# Patient Record
Sex: Female | Born: 1935 | Race: White | Hispanic: No | Smoking: Never smoker
Health system: Southern US, Community
[De-identification: ages and names within clinical notes are randomized; demographics above are authoritative.]

## PROBLEM LIST (undated history)

## (undated) DIAGNOSIS — A0472 Enterocolitis due to Clostridium difficile, not specified as recurrent: Secondary | ICD-10-CM

## (undated) DIAGNOSIS — I10 Essential (primary) hypertension: Secondary | ICD-10-CM

## (undated) DIAGNOSIS — I441 Atrioventricular block, second degree: Secondary | ICD-10-CM

## (undated) DIAGNOSIS — Z5189 Encounter for other specified aftercare: Secondary | ICD-10-CM

## (undated) DIAGNOSIS — E039 Hypothyroidism, unspecified: Secondary | ICD-10-CM

## (undated) DIAGNOSIS — J302 Other seasonal allergic rhinitis: Secondary | ICD-10-CM

## (undated) DIAGNOSIS — M1990 Unspecified osteoarthritis, unspecified site: Secondary | ICD-10-CM

## (undated) HISTORY — PX: CYST EXCISION: SHX5701

## (undated) HISTORY — PX: EYE SURGERY: SHX253

## (undated) HISTORY — PX: OTHER SURGICAL HISTORY: SHX169

---
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
## 2000-02-17 ENCOUNTER — Other Ambulatory Visit: Admission: RE | Admit: 2000-02-17 | Discharge: 2000-02-17 | Payer: Self-pay | Admitting: Internal Medicine

## 2005-05-05 ENCOUNTER — Emergency Department (HOSPITAL_COMMUNITY): Admission: EM | Admit: 2005-05-05 | Discharge: 2005-05-05 | Payer: Self-pay | Admitting: Emergency Medicine

## 2006-11-21 ENCOUNTER — Emergency Department (HOSPITAL_COMMUNITY): Admission: EM | Admit: 2006-11-21 | Discharge: 2006-11-21 | Payer: Self-pay | Admitting: Emergency Medicine

## 2011-04-13 ENCOUNTER — Encounter (HOSPITAL_COMMUNITY): Payer: Self-pay | Admitting: Emergency Medicine

## 2011-04-13 ENCOUNTER — Emergency Department (HOSPITAL_COMMUNITY)
Admission: EM | Admit: 2011-04-13 | Discharge: 2011-04-14 | Disposition: A | Discharge status: Home / Self Care | Payer: Medicare PPO | Attending: Emergency Medicine | Admitting: Emergency Medicine

## 2011-04-13 DIAGNOSIS — N939 Abnormal uterine and vaginal bleeding, unspecified: Secondary | ICD-10-CM

## 2011-04-13 DIAGNOSIS — N898 Other specified noninflammatory disorders of vagina: Secondary | ICD-10-CM | POA: Insufficient documentation

## 2011-04-13 HISTORY — DX: Essential (primary) hypertension: I10

## 2011-04-13 NOTE — ED Notes (Signed)
ZOX:[id number]<AV> Expected date:<BR> Expected time:<BR> Means of arrival:<BR> Comments:<BR> HOLd for triage 1

## 2011-04-13 NOTE — ED Provider Notes (Signed)
History     CSN: [id number]  Arrival date & time 04/13/11  2230   First MD Initiated Contact with Patient 04/13/11 2330      Chief Complaint  Patient presents with  . Vaginal Bleeding    (Consider location/radiation/quality/duration/timing/severity/associated sxs/prior treatment) HPI Comments: Went into bathroom, started having bleeding from vagina.  She says this ran down her leg.  No pain injury or trauma.    Patient is a 78 y.o. female presenting with vaginal bleeding. The history is provided by the patient.  Vaginal Bleeding This is a new problem. The current episode started 3 to 5 hours ago. The problem has been resolved. Pertinent negatives include no abdominal pain. The symptoms are aggravated by nothing. The symptoms are relieved by nothing. She has tried nothing for the symptoms.    Past Medical History  Diagnosis Date  . Diabetes mellitus   . Hypertension   . Thyroid disease     History reviewed. No pertinent past surgical history.  No family history on file.  History  Substance Use Topics  . Smoking status: Never Smoker   . Smokeless tobacco: Not on file  . Alcohol Use: No    OB History    Grav Para Term Preterm Abortions TAB SAB Ect Mult Living                  Review of Systems  Gastrointestinal: Negative for abdominal pain.  Genitourinary: Positive for vaginal bleeding.  All other systems reviewed and are negative.    Allergies  Penicillins  Home Medications  No current outpatient prescriptions on file.  BP 210/79  Pulse 74  Temp(Src) 98.4 F (36.9 C) (Oral)  Resp 20  SpO2 99%  Physical Exam  Nursing note and vitals reviewed. Constitutional: She is oriented to person, place, and time. She appears well-developed and well-nourished.  HENT:  Head: Normocephalic and atraumatic.  Neck: Normal range of motion. Neck supple.  Cardiovascular: Normal rate and regular rhythm.   No murmur heard. Pulmonary/Chest: Effort normal and breath  sounds normal. No respiratory distress.  Abdominal: Soft. Bowel sounds are normal. She exhibits no distension. There is no tenderness.  Genitourinary: Vagina normal. No vaginal discharge found.       There was slight blood in the vagina.  No active bleeding.  Otherwise no abnormalities noted.  Musculoskeletal: Normal range of motion. She exhibits no edema.  Neurological: She is alert and oriented to person, place, and time.  Skin: Skin is warm and dry. She is not diaphoretic.    ED Course  Procedures (including critical care time)   Labs Reviewed  CBC  DIFFERENTIAL  COMPREHENSIVE METABOLIC PANEL  APTT  PROTIME-INR  TYPE AND SCREEN   No results found.   No diagnosis found.    MDM  No active bleeding, Hb stable.  US shows what appears to be a mass in the uterus.  She needs to follow up with Gyn.  Was given the name of the on-call Gyn to follow up with.  Should go to [facility] if worsens.        Geoffery Lyons, MD 04/14/11 [phone]

## 2011-04-13 NOTE — ED Notes (Signed)
Pt alert, nad, c/o vaginall bleeding, onset this evening, pt states she was in restroom, sudden onset, states bleeding has decreased, resp even unlabored, skin pwd

## 2011-04-14 ENCOUNTER — Emergency Department (HOSPITAL_COMMUNITY): Payer: Medicare PPO

## 2011-04-14 LAB — APTT: aPTT: 48 seconds — ABNORMAL HIGH (ref 24–37)

## 2011-04-14 LAB — CBC
MCHC: 33.8 g/dL (ref 30.0–36.0)
RBC: 4.23 MIL/uL (ref 3.87–5.11)
WBC: 5.1 10*3/uL (ref 4.0–10.5)

## 2011-04-14 LAB — TYPE AND SCREEN
ABO/RH(D): O POS
Antibody Screen: NEGATIVE

## 2011-04-14 LAB — DIFFERENTIAL
Eosinophils Relative: 4 % (ref 0–5)
Lymphocytes Relative: 23 % (ref 12–46)
Lymphs Abs: 1.2 10*3/uL (ref 0.7–4.0)
Neutro Abs: 3.2 10*3/uL (ref 1.7–7.7)
Neutrophils Relative %: 62 % (ref 43–77)

## 2011-04-14 LAB — COMPREHENSIVE METABOLIC PANEL
AST: 20 U/L (ref 0–37)
Alkaline Phosphatase: 118 U/L — ABNORMAL HIGH (ref 39–117)
BUN: 16 mg/dL (ref 6–23)
CO2: 31 mEq/L (ref 19–32)
GFR calc Af Amer: 89 mL/min — ABNORMAL LOW (ref >90)
GFR calc non Af Amer: 77 mL/min — ABNORMAL LOW (ref >90)
Sodium: 141 mEq/L (ref 135–145)
Total Protein: 6.8 g/dL (ref 6.0–8.3)

## 2011-04-14 LAB — PROTIME-INR: INR: 1.07 (ref 0.00–1.49)

## 2011-04-14 LAB — ABO/RH: ABO/RH(D): O POS

## 2011-05-30 ENCOUNTER — Encounter (HOSPITAL_COMMUNITY): Payer: Self-pay

## 2011-05-31 ENCOUNTER — Other Ambulatory Visit: Payer: Self-pay | Admitting: Obstetrics & Gynecology

## 2011-06-08 ENCOUNTER — Encounter (HOSPITAL_COMMUNITY)
Admission: RE | Admit: 2011-06-08 | Discharge: 2011-06-08 | Disposition: A | Discharge status: Home / Self Care | Payer: Medicare PPO | Source: Ambulatory Visit | Attending: Obstetrics & Gynecology | Admitting: Obstetrics & Gynecology

## 2011-06-08 ENCOUNTER — Other Ambulatory Visit: Payer: Self-pay

## 2011-06-08 ENCOUNTER — Encounter (HOSPITAL_COMMUNITY): Payer: Self-pay

## 2011-06-08 HISTORY — DX: Hypothyroidism, unspecified: E03.9

## 2011-06-08 HISTORY — DX: Unspecified osteoarthritis, unspecified site: M19.90

## 2011-06-08 HISTORY — DX: Encounter for other specified aftercare: Z51.89

## 2011-06-08 HISTORY — DX: Other seasonal allergic rhinitis: J30.2

## 2011-06-08 LAB — CBC
MCH: 31.6 pg (ref 26.0–34.0)
MCV: 94.7 fL (ref 78.0–100.0)
Platelets: 173 10*3/uL (ref 150–400)
RBC: 4.34 MIL/uL (ref 3.87–5.11)
RDW: 14.1 % (ref 11.5–15.5)
WBC: 6.2 10*3/uL (ref 4.0–10.5)

## 2011-06-08 LAB — BASIC METABOLIC PANEL
CO2: 29 mEq/L (ref 19–32)
Calcium: 9.2 mg/dL (ref 8.4–10.5)
Creatinine, Ser: 0.78 mg/dL (ref 0.50–1.10)
GFR calc non Af Amer: 73 mL/min — ABNORMAL LOW (ref >90)
Glucose, Bld: 122 mg/dL — ABNORMAL HIGH (ref 70–99)
Sodium: 140 mEq/L (ref 135–145)

## 2011-06-08 MED ORDER — DEXTROSE 5 % IV SOLN
INTRAVENOUS | Status: AC
  Administered 2011-06-09: 100 mL via INTRAVENOUS
  Filled 2011-06-08: qty 2.5

## 2011-06-08 NOTE — Pre-Procedure Instructions (Signed)
Spoke with Dr Rodman Pickle, patient instructed to take bp meds, synthroid, and 1/2 NPH dose Thursday evening and 1/2 Friday morning dose DOS 06/09/11.  Dr Rodman Pickle informed bp today was 168/75 with 66 pulse.  No orders given.

## 2011-06-08 NOTE — Pre-Procedure Instructions (Signed)
Dr Rodman Pickle spoke with patient and her daughter Kari Ford.  Discussed her EKG results.  Ok for surgery as scheduled.  Dr Rodman Pickle to speak with Dr Brayton Caves as plan on DOS.  EKG copy given to patient to share with her primary care physician Dr Merri Brunette.

## 2011-06-08 NOTE — Patient Instructions (Addendum)
   Your procedure is scheduled on: Friday, April 12th   Enter through the [employer] of [facility] at: 6:00am Pick up the phone at the desk and dial [phone] and inform us of your arrival.  Please call this number if you have any problems the morning of surgery: [phone]  Remember: Do not eat food after midnight:Thursday Do not drink clear liquids after: Thursday Take these medicines the morning of surgery with a SIP OF WATER:  Per Anesthesia,take BP meds and Synthroid with small sip of water Friday morning - DOS.  Patient instructed to take 1/2 NPH evening dose Thursday night and 1/2 Friday morning dose DOS.   Do not wear jewelry, make-up, or FINGER nail polish Do not wear lotions, powders, perfumes or deodorant. Do not shave 48 hours prior to surgery. Do not bring valuables to the hospital. Contacts, dentures or bridgework may not be worn into surgery.  . Patients discharged on the day of surgery will not be allowed to drive home.  Home with daughter, Quatisha Zylka  cell [phone]   Remember to use your hibiclens as instructed.Please shower with 1/2 bottle the evening before your surgery and the other 1/2 bottle the morning of surgery. Neck down avoiding private area.

## 2011-06-08 NOTE — Anesthesia Preprocedure Evaluation (Signed)
Anesthesia Evaluation  Patient identified by MRN, date of birth, ID band Patient awake    Reviewed: Allergy & Precautions, H&P , NPO status , Patient's Chart, lab work & pertinent test results, reviewed documented beta blocker date and time   History of Anesthesia Complications Negative for: history of anesthetic complications  Airway       Dental   Pulmonary neg pulmonary ROS,          Cardiovascular Exercise Tolerance: Good hypertension, On Medications and On Home Beta Blockers  EKG shows first degree av block and PVCs.  No prior EKG available for comparison.  Discussed with patient need for further testing - patient refuses.     Neuro/Psych negative neurological ROS  negative psych ROS   GI/Hepatic negative GI ROS, Neg liver ROS,   Endo/Other  Diabetes mellitus-, Type 2, Insulin DependentHypothyroidism   Renal/GU negative Renal ROS  negative genitourinary   Musculoskeletal  (+) Arthritis -,   Abdominal   Peds  Hematology negative hematology ROS (+)   Anesthesia Other Findings   Reproductive/Obstetrics negative OB ROS                           Anesthesia Physical Anesthesia Plan  ASA: III  Anesthesia Plan: MAC   Post-op Pain Management:    Induction:   Airway Management Planned:   Additional Equipment:   Intra-op Plan:   Post-operative Plan:   Informed Consent: I have reviewed the patients History and Physical, chart, labs and discussed the procedure including the risks, benefits and alternatives for the proposed anesthesia with the patient or authorized representative who has indicated his/her understanding and acceptance.   Dental Advisory Given  Plan Discussed with: CRNA and Surgeon  Anesthesia Plan Comments: (Discussed with patient and daughter [name]) with Janene Harvey, RN present, that due to EKG findings it is my recommendation that patient undergo evaluation by her  PCP and may need further testing prior to surgery and anesthesia.  Patient is adamant that she not have further testing.  She says that "if it's my time, it's my time."  She says that "God has protected me for almost 89 years and if he is ready for me to go then I am ready."  Daughter cites patient's lack of CP or SOB as evidence that she does not have heart problems.  Both want to proceed without further testing against my recommendations, and very politely thanked me for my input.  The patient wants the least amount of intervention possible and is planning to have the procedure under local anesthesia.  I have discussed this with Dr Juliene Pina.)        Anesthesia Quick Evaluation

## 2011-06-08 NOTE — Pre-Procedure Instructions (Signed)
Sangita in lab at [facility] hospital informed of patient's history of prior blood transfusion.

## 2011-06-09 ENCOUNTER — Ambulatory Visit (HOSPITAL_COMMUNITY)
Admission: RE | Admit: 2011-06-09 | Discharge: 2011-06-09 | Disposition: A | Discharge status: Home / Self Care | Payer: Medicare PPO | Source: Ambulatory Visit | Attending: Obstetrics & Gynecology | Admitting: Obstetrics & Gynecology

## 2011-06-09 ENCOUNTER — Encounter (HOSPITAL_COMMUNITY)
Admission: RE | Disposition: A | Discharge status: Home / Self Care | Payer: Self-pay | Source: Ambulatory Visit | Attending: Obstetrics & Gynecology

## 2011-06-09 ENCOUNTER — Encounter (HOSPITAL_COMMUNITY): Payer: Self-pay | Admitting: Anesthesiology

## 2011-06-09 ENCOUNTER — Ambulatory Visit (HOSPITAL_COMMUNITY): Payer: Medicare PPO | Admitting: Anesthesiology

## 2011-06-09 DIAGNOSIS — I10 Essential (primary) hypertension: Secondary | ICD-10-CM | POA: Insufficient documentation

## 2011-06-09 DIAGNOSIS — E119 Type 2 diabetes mellitus without complications: Secondary | ICD-10-CM | POA: Insufficient documentation

## 2011-06-09 DIAGNOSIS — N840 Polyp of corpus uteri: Secondary | ICD-10-CM | POA: Insufficient documentation

## 2011-06-09 DIAGNOSIS — Z01812 Encounter for preprocedural laboratory examination: Secondary | ICD-10-CM | POA: Insufficient documentation

## 2011-06-09 DIAGNOSIS — Z01818 Encounter for other preprocedural examination: Secondary | ICD-10-CM | POA: Insufficient documentation

## 2011-06-09 DIAGNOSIS — N950 Postmenopausal bleeding: Secondary | ICD-10-CM | POA: Insufficient documentation

## 2011-06-09 DIAGNOSIS — E039 Hypothyroidism, unspecified: Secondary | ICD-10-CM | POA: Insufficient documentation

## 2011-06-09 LAB — GLUCOSE, CAPILLARY: Glucose-Capillary: 129 mg/dL — ABNORMAL HIGH (ref 70–99)

## 2011-06-09 SURGERY — DILATATION & CURETTAGE/HYSTEROSCOPY WITH RESECTOCOPE
Anesthesia: Monitor Anesthesia Care | Site: Vagina | Wound class: Clean Contaminated

## 2011-06-09 MED ORDER — BUPIVACAINE HCL 0.75 % IJ SOLN
INTRAMUSCULAR | Status: DC | PRN
  Administered 2011-06-09: 10 mg via INTRATHECAL

## 2011-06-09 MED ORDER — FENTANYL CITRATE 0.05 MG/ML IJ SOLN
INTRAMUSCULAR | Status: AC
  Filled 2011-06-09: qty 2

## 2011-06-09 MED ORDER — LIDOCAINE HCL 1 % IJ SOLN
INTRAMUSCULAR | Status: DC | PRN
  Administered 2011-06-09: 20 mL

## 2011-06-09 MED ORDER — LACTATED RINGERS IV SOLN
INTRAVENOUS | Status: DC
  Administered 2011-06-09: 07:00:00 via INTRAVENOUS

## 2011-06-09 MED ORDER — LIDOCAINE HCL 0.5 % IJ SOLN
INTRAMUSCULAR | Status: AC
  Filled 2011-06-09: qty 1

## 2011-06-09 MED ORDER — SODIUM CHLORIDE 0.9 % IR SOLN
Status: DC | PRN
  Administered 2011-06-09 (×2): 3000 mL

## 2011-06-09 SURGICAL SUPPLY — 20 items
CANISTER SUCTION 2500CC (MISCELLANEOUS) ×2 IMPLANT
CATH ROBINSON RED A/P 16FR (CATHETERS) ×2 IMPLANT
CLOTH BEACON ORANGE TIMEOUT ST (SAFETY) ×2 IMPLANT
CONTAINER PREFILL 10% NBF 60ML (FORM) ×4 IMPLANT
DILATOR CANAL MILEX (MISCELLANEOUS) ×1 IMPLANT
ELECT REM PT RETURN 9FT ADLT (ELECTROSURGICAL) ×2
ELECTRODE REM PT RTRN 9FT ADLT (ELECTROSURGICAL) ×1 IMPLANT
ELECTRODE ROLLER VERSAPOINT (ELECTRODE) IMPLANT
ELECTRODE RT ANGLE VERSAPOINT (CUTTING LOOP) ×1 IMPLANT
GLOVE BIO SURGEON STRL SZ7 (GLOVE) ×4 IMPLANT
GLOVE BIOGEL PI IND STRL 7.0 (GLOVE) ×1 IMPLANT
GLOVE BIOGEL PI INDICATOR 7.0 (GLOVE) ×1
GOWN PREVENTION PLUS LG XLONG (DISPOSABLE) ×4 IMPLANT
GOWN STRL REIN XL XLG (GOWN DISPOSABLE) ×2 IMPLANT
LOOP ANGLED CUTTING 22FR (CUTTING LOOP) IMPLANT
PACK HYSTEROSCOPY LF (CUSTOM PROCEDURE TRAY) ×2 IMPLANT
SUT VICRYL 0 UR6 27IN ABS (SUTURE) ×1 IMPLANT
TOWEL OR 17X24 6PK STRL BLUE (TOWEL DISPOSABLE) ×4 IMPLANT
TUBING HYDROFLEX HYSTEROSCOPY (TUBING) IMPLANT
WATER STERILE IRR 1000ML POUR (IV SOLUTION) ×2 IMPLANT

## 2011-06-09 NOTE — Anesthesia Procedure Notes (Signed)
Spinal  Patient location during procedure: OR Start time: 06/09/2011 7:41 AM Staffing Anesthesiologist: Brayton Caves R Performed by: anesthesiologist  Preanesthetic Checklist Completed: patient identified, site marked, surgical consent, pre-op evaluation, timeout performed, IV checked, risks and benefits discussed and monitors and equipment checked Spinal Block Patient position: sitting Prep: DuraPrep Patient monitoring: heart rate, cardiac monitor, continuous pulse ox and blood pressure Approach: midline Location: L3-4 Injection technique: single-shot Needle Needle type: Sprotte  Needle gauge: 24 G Needle length: 9 cm Assessment Sensory level: T4 Additional Notes Patient identified.  Risk benefits discussed including failed block, incomplete pain control, headache, nerve damage, paralysis, blood pressure changes, nausea, vomiting, reactions to medication both toxic or allergic, and postpartum back pain.  Patient expressed understanding and wished to proceed.  All questions were answered.  Sterile technique used throughout procedure.  CSF was clear.  No parasthesia or other complications.  Please see nursing notes for vital signs.

## 2011-06-09 NOTE — Discharge Instructions (Signed)
DISCHARGE INSTRUCTIONS: D&C / D&E The following instructions have been prepared to help you care for yourself upon your return home.   Personal hygiene: Marland Kitchen Use sanitary pads for vaginal drainage, not tampons. . Shower the day after your procedure. . NO tub baths, pools or Jacuzzis for 2-3 weeks. . Wipe front to back after using the bathroom.  Activity and limitations: . Do NOT drive or operate any equipment for 24 hours. The effects of anesthesia are still present and drowsiness may result. . Do NOT rest in bed all day. . Walking is encouraged. . Walk up and down stairs slowly. . You may resume your normal activity in one to two days or as indicated by your physician.  Sexual activity: NO intercourse for at least 2 weeks after the procedure, or as indicated by your physician.  Diet: Eat a light meal as desired this evening. You may resume your usual diet tomorrow.  Return to work: You may resume your work activities in one to two days or as indicated by your doctor.  What to expect after your surgery: Expect to have vaginal bleeding/discharge for 2-3 days and spotting for up to 10 days. It is not unusual to have soreness for up to 1-2 weeks. You may have a slight burning sensation when you urinate for the first day. Mild cramps may continue for a couple of days. You may have a regular period in 2-6 weeks.  Call your doctor for any of the following: . Excessive vaginal bleeding, saturating and changing one pad every hour. . Inability to urinate 6 hours after discharge from hospital. . Pain not relieved by pain medication. . Fever of 100.4 F or greater. . Unusual vaginal discharge or odor.  Return to office ________________ Call for an appointment ___________________  Patient's signature: ______________________  Nurse's signature ________________________  Post Anesthesia Care Unit [phone]

## 2011-06-09 NOTE — Op Note (Addendum)
Preoperative diagnosis: Postmenopausal bleeding with endometrial mass, failed office biopsy Postop diagnosis: as above.  Procedure: Hysteroscopic polypectomy with Versapoint Anesthesia: Spinal  Surgeon: Shea Evans, MD  Assistant:  none IV fluids : LR Saline fluid deficit: 600 cc Estimated blood loss: 30 cc  Urine output: straight catheter preop   Complications none  Condition: stable  Disposition:  PACU and then home Specimen: Resected endometrial polyp  Procedure  Indication: Postmenopausal bleeding, pelvic ultrasound noted large endometrial mass. Two office endometrial biopsies failed to obtain endometrial tissue for pathologic evaluation, hence decision made to undergo Hysteroscopic evaluation.  Patient was counseled on risks/ complications including infection, bleeding, damage to internal organs, she understood and agrees, gave informed written consent.  Patient was brought to the operating room with IV running. Time out was carried out. She received preop Gentamicin and Clindamycin. She underwent Spinal anesthesia without complications. She was given dorsolithotomy position. Parts were prepped and draped in standard fashion. Bladder was catheterized once (before annd after the surgery). Bimanual exam revealed uterus to be anteverted and normal size. Speculum was placed and cervix was grasped with single-tooth tenaculum.   Cervical block with 10 cc 1% plain Xylocaine given. The uterus was sounded to 8 cm. Cervical os was dilated to 25 F. Diagnostic Hysteroscope was introduced in the uterine cavity under vision, using saline for irrigation. A large endometrial polyp noted from lower right side of the uterine cavity. Normal tubal osteii noted. Versapoint hysteroscope switched. Polyp resection done with loop electrode and segments of resected polyp sent to pathology. Hemostasis was excellent. Hysteroscope was removed. Fluid deficit 600 cc.  Anterior lip of cervix was bleeding where  tenaculum caused a laceration. It was sutured with 0Vicryl.  All instruments removed. All counts are correct x2. No complications.  Patient was made supine and brought to the recovery room in stable condition.  Patient will be discharged home today.  Follow up in 2 weeks in office. Surgical findings, warning signs of infection and excessive bleeding reviewed with patient and her family.   V.Amari Zagal, MD.

## 2011-06-09 NOTE — H&P (Addendum)
Kari Ford is an 78 y.o. female with postmenopausal bleeding since Feb'13. Sono notes endometrial mass. Office endometrial biopsy attempted twice with cytotec pre-biopsy, 2nd attempt was thought to be successful but path noted no endometrial tissue. Patient here for more definitve diagnosis since high likelihood of endometrial cancer.  Plan was to have procedure under regional anesthesia. But due to EKG changes and patient's reluctance to undergo further testing of her heart, we are proceeding with H/scopy and biopsy under local anesthesia.  Normal paps, not sexually active since widowed. Normal breast exam and mammogram, but not regular with it, no h/o breast cancer.      Past Medical History  Diagnosis Date  . Diabetes mellitus   . Hypertension   . Thyroid disease   . Hypothyroidism   . Blood transfusion 1952    Bld Transfusion w/C/S  - [facility] -[place], [place]  . Seasonal allergies     no meds  . Arthritis     shoulder, back    Past Surgical History  Procedure Date  . Eye surgery     bilateral - cataract surgery  . Svd     x 1    No family history on file.  Social History:  reports that she has never smoked. She has never used smokeless tobacco. She reports that she does not drink alcohol or use illicit drugs.  Allergies:  Allergies  Allergen Reactions  . Penicillins Hives    Prescriptions prior to admission  Medication Sig Dispense Refill  . aspirin 81 MG EC tablet Take 81 mg by mouth daily. Swallow whole.      . insulin NPH (HUMULIN N,NOVOLIN N) 100 UNIT/ML injection Inject 10-18 Units into the skin 2 (two) times daily. 18 units in the morning and 10 units in the evening      . irbesartan (AVAPRO) 300 MG tablet Take 300 mg by mouth daily.      Marland Kitchen levothyroxine (SYNTHROID, LEVOTHROID) 50 MCG tablet Take 50 mcg by mouth daily.      . nebivolol (BYSTOLIC) 2.5 MG tablet Take 2.5 mg by mouth daily.        Review of Systems  Constitutional: Negative for fever.    HENT: Positive for hearing loss.   Eyes: Negative for blurred vision and double vision.  Respiratory: Negative for shortness of breath.   Cardiovascular: Negative for chest pain.  Gastrointestinal: Negative for heartburn.  Genitourinary: Negative for dysuria.  Neurological: Negative for dizziness and headaches.    Physical Exam Blood pressure 192/50, pulse 51, temperature 97.9 F (36.6 C), temperature source Oral, resp. rate 18, SpO2 99.00% A&O x 3, no acute distress. Pleasant HEENT neg Lungs CTA bilat CV RRR,S1S2 normal Abdo soft, non tender, non acute Extr no edema/ tenderness Pelvic  Cervical stenosis, no masses in vagina or on cervix. Slightly enlarged uterus. No adnexal masses   Results for orders placed during the hospital encounter of 06/08/11 (from the past 24 hour(s))  BASIC METABOLIC PANEL     Status: Abnormal   Collection Time   06/08/11 11:06 AM      Component Value Range   Sodium 140  135 - 145 (mEq/L)   Potassium 4.4  3.5 - 5.1 (mEq/L)   Chloride 103  96 - 112 (mEq/L)   CO2 29  19 - 32 (mEq/L)   Glucose, Bld 122 (*) 70 - 99 (mg/dL)   BUN 19  6 - 23 (mg/dL)   Creatinine, Ser 0.86  0.50 - 1.10 (  mg/dL)   Calcium 9.2  8.4 - 16.1 (mg/dL)   GFR calc non Af Amer 73 (*) >90 (mL/min)   GFR calc Af Amer 84 (*) >90 (mL/min)  CBC     Status: Normal   Collection Time   06/08/11 11:06 AM      Component Value Range   WBC 6.2  4.0 - 10.5 (K/uL)   RBC 4.34  3.87 - 5.11 (MIL/uL)   Hemoglobin 13.7  12.0 - 15.0 (g/dL)   HCT 09.6  04.5 - 40.9 (%)   MCV 94.7  78.0 - 100.0 (fL)   MCH 31.6  26.0 - 34.0 (pg)   MCHC 33.3  30.0 - 36.0 (g/dL)   RDW 81.1  91.4 - 78.2 (%)   Platelets 173  150 - 400 (K/uL)    Assessment/Plan: 78 yo woman with DM. HTN, hypothyroidism. Postmenopausal bleeding, unable to get office endometrial biopsy. Here for Hysteroscopic eval and possible removal of endometrial mass.   Risks/complications of surgery reviewed incl infection, bleeding, damage to  internal organs including bladder, bowels, ureters, blood vessels, other risks from anesthesia, VTE and delayed complications of any surgery, complications in future surgery reviewed.   Joda Braatz R 06/09/2011, 6:33 AM

## 2011-06-09 NOTE — Transfer of Care (Signed)
Immediate Anesthesia Transfer of Care Note  Patient: Kari Ford  Procedure(s) Performed: Procedure(s) (LRB): DILATATION & CURETTAGE/HYSTEROSCOPY WITH RESECTOCOPE (N/A)  Patient Location: PACU  Anesthesia Type: Spinal  Level of Consciousness: awake, alert  and oriented  Airway & Oxygen Therapy: Patient Spontanous Breathing  Post-op Assessment: Report given to PACU RN and Post -op Vital signs reviewed and stable  Post vital signs: stable  Complications: No apparent anesthesia complications

## 2011-06-09 NOTE — Anesthesia Postprocedure Evaluation (Signed)
Anesthesia Post Note  Patient: Kari Ford  Procedure(s) Performed: Procedure(s) (LRB): DILATATION & CURETTAGE/HYSTEROSCOPY WITH RESECTOCOPE (N/A)  Anesthesia type: GA  Patient location: PACU  Post pain: Pain level controlled  Post assessment: Post-op Vital signs reviewed  Last Vitals:  Filed Vitals:   06/09/11 0945  BP: 167/59  Pulse: 62  Temp:   Resp:     Post vital signs: Reviewed  Level of consciousness: sedated  Complications: No apparent anesthesia complications

## 2012-06-26 ENCOUNTER — Other Ambulatory Visit: Payer: Self-pay

## 2012-08-29 ENCOUNTER — Encounter (HOSPITAL_COMMUNITY): Payer: Self-pay | Admitting: Emergency Medicine

## 2012-08-29 ENCOUNTER — Inpatient Hospital Stay (HOSPITAL_COMMUNITY)
Admission: EM | Admit: 2012-08-29 | Discharge: 2012-08-31 | DRG: 603 | Disposition: A | Discharge status: Home Health | Payer: Medicare PPO | Attending: Internal Medicine | Admitting: Internal Medicine

## 2012-08-29 DIAGNOSIS — R739 Hyperglycemia, unspecified: Secondary | ICD-10-CM

## 2012-08-29 DIAGNOSIS — I10 Essential (primary) hypertension: Secondary | ICD-10-CM | POA: Diagnosis present

## 2012-08-29 DIAGNOSIS — L02419 Cutaneous abscess of limb, unspecified: Principal | ICD-10-CM | POA: Diagnosis present

## 2012-08-29 DIAGNOSIS — E039 Hypothyroidism, unspecified: Secondary | ICD-10-CM | POA: Diagnosis present

## 2012-08-29 DIAGNOSIS — L03116 Cellulitis of left lower limb: Secondary | ICD-10-CM | POA: Diagnosis present

## 2012-08-29 DIAGNOSIS — R609 Edema, unspecified: Secondary | ICD-10-CM | POA: Diagnosis present

## 2012-08-29 DIAGNOSIS — M7989 Other specified soft tissue disorders: Secondary | ICD-10-CM | POA: Diagnosis present

## 2012-08-29 DIAGNOSIS — E860 Dehydration: Secondary | ICD-10-CM

## 2012-08-29 DIAGNOSIS — E876 Hypokalemia: Secondary | ICD-10-CM | POA: Diagnosis present

## 2012-08-29 DIAGNOSIS — M79609 Pain in unspecified limb: Secondary | ICD-10-CM | POA: Diagnosis present

## 2012-08-29 DIAGNOSIS — E119 Type 2 diabetes mellitus without complications: Secondary | ICD-10-CM | POA: Diagnosis present

## 2012-08-29 LAB — CBC WITH DIFFERENTIAL/PLATELET
Basophils Absolute: 0.1 10*3/uL (ref 0.0–0.1)
Eosinophils Relative: 4 % (ref 0–5)
Lymphocytes Relative: 25 % (ref 12–46)
Lymphs Abs: 1.4 10*3/uL (ref 0.7–4.0)
MCV: 93.8 fL (ref 78.0–100.0)
Neutro Abs: 3.3 10*3/uL (ref 1.7–7.7)
Platelets: 147 10*3/uL — ABNORMAL LOW (ref 150–400)
RBC: 3.85 MIL/uL — ABNORMAL LOW (ref 3.87–5.11)
WBC: 5.4 10*3/uL (ref 4.0–10.5)

## 2012-08-29 LAB — BASIC METABOLIC PANEL
CO2: 33 mEq/L — ABNORMAL HIGH (ref 19–32)
Calcium: 9.2 mg/dL (ref 8.4–10.5)
Chloride: 101 mEq/L (ref 96–112)
Glucose, Bld: 212 mg/dL — ABNORMAL HIGH (ref 70–99)
Potassium: 3.9 mEq/L (ref 3.5–5.1)
Sodium: 139 mEq/L (ref 135–145)

## 2012-08-29 LAB — GLUCOSE, CAPILLARY: Glucose-Capillary: 180 mg/dL — ABNORMAL HIGH (ref 70–99)

## 2012-08-29 NOTE — ED Notes (Addendum)
Patient with redness and swelling to left leg.  She fell Monday at home and left leg also has some bruising and she has a healing laceration to left elbow.  Daughter reports patient's blood sugar has been dropping recently into the 20's and 30's.  No recent changes to her appetite or medications.  She takes humulin insulin 13 units in AM and 10 in evening.  She is also having pain and redness in her left second toe.

## 2012-08-30 ENCOUNTER — Emergency Department (HOSPITAL_COMMUNITY): Payer: Medicare PPO

## 2012-08-30 ENCOUNTER — Encounter (HOSPITAL_COMMUNITY): Payer: Self-pay | Admitting: Orthopedic Surgery

## 2012-08-30 DIAGNOSIS — E039 Hypothyroidism, unspecified: Secondary | ICD-10-CM | POA: Diagnosis present

## 2012-08-30 DIAGNOSIS — L02419 Cutaneous abscess of limb, unspecified: Principal | ICD-10-CM

## 2012-08-30 DIAGNOSIS — E119 Type 2 diabetes mellitus without complications: Secondary | ICD-10-CM | POA: Diagnosis present

## 2012-08-30 DIAGNOSIS — E860 Dehydration: Secondary | ICD-10-CM

## 2012-08-30 DIAGNOSIS — L03116 Cellulitis of left lower limb: Secondary | ICD-10-CM | POA: Diagnosis present

## 2012-08-30 DIAGNOSIS — L03119 Cellulitis of unspecified part of limb: Principal | ICD-10-CM

## 2012-08-30 DIAGNOSIS — M7989 Other specified soft tissue disorders: Secondary | ICD-10-CM

## 2012-08-30 DIAGNOSIS — E876 Hypokalemia: Secondary | ICD-10-CM | POA: Diagnosis present

## 2012-08-30 LAB — GLUCOSE, CAPILLARY
Glucose-Capillary: 128 mg/dL — ABNORMAL HIGH (ref 70–99)
Glucose-Capillary: 215 mg/dL — ABNORMAL HIGH (ref 70–99)
Glucose-Capillary: 176 mg/dL — ABNORMAL HIGH (ref 70–99)

## 2012-08-30 LAB — BASIC METABOLIC PANEL
BUN: 25 mg/dL — ABNORMAL HIGH (ref 6–23)
GFR calc non Af Amer: 75 mL/min — ABNORMAL LOW (ref >90)
Glucose, Bld: 207 mg/dL — ABNORMAL HIGH (ref 70–99)
Potassium: 3.4 mEq/L — ABNORMAL LOW (ref 3.5–5.1)

## 2012-08-30 LAB — URINALYSIS, ROUTINE W REFLEX MICROSCOPIC
Glucose, UA: 100 mg/dL — AB
Leukocytes, UA: NEGATIVE
Nitrite: NEGATIVE
Specific Gravity, Urine: 1.02 (ref 1.005–1.030)
pH: 7.5 (ref 5.0–8.0)

## 2012-08-30 LAB — CBC
HCT: 32.1 % — ABNORMAL LOW (ref 36.0–46.0)
Hemoglobin: 10.9 g/dL — ABNORMAL LOW (ref 12.0–15.0)
MCH: 31.9 pg (ref 26.0–34.0)
MCHC: 34 g/dL (ref 30.0–36.0)

## 2012-08-30 LAB — HEMOGLOBIN A1C: Hgb A1c MFr Bld: 5.9 % — ABNORMAL HIGH (ref <5.7)

## 2012-08-30 MED ORDER — VANCOMYCIN HCL 10 G IV SOLR
1250.0000 mg | INTRAVENOUS | Status: DC
  Administered 2012-08-30: 1250 mg via INTRAVENOUS
  Filled 2012-08-30 (×2): qty 1250

## 2012-08-30 MED ORDER — HYDROCODONE-ACETAMINOPHEN 5-325 MG PO TABS: 1.0000 | ORAL_TABLET | ORAL | Status: DC | PRN

## 2012-08-30 MED ORDER — ONDANSETRON HCL 4 MG/2ML IJ SOLN: 4.0000 mg | Freq: Three times a day (TID) | INTRAMUSCULAR | Status: DC | PRN

## 2012-08-30 MED ORDER — OXYCODONE-ACETAMINOPHEN 5-325 MG PO TABS
1.0000 | ORAL_TABLET | Freq: Once | ORAL | Status: AC
  Administered 2012-08-30: 1 via ORAL
  Filled 2012-08-30: qty 1

## 2012-08-30 MED ORDER — ENOXAPARIN SODIUM 30 MG/0.3ML ~~LOC~~ SOLN: 30.0000 mg | SUBCUTANEOUS | Status: DC

## 2012-08-30 MED ORDER — ONDANSETRON HCL 4 MG/2ML IJ SOLN: 4.0000 mg | Freq: Four times a day (QID) | INTRAMUSCULAR | Status: DC | PRN

## 2012-08-30 MED ORDER — IRBESARTAN 300 MG PO TABS
300.0000 mg | ORAL_TABLET | Freq: Every day | ORAL | Status: DC
  Administered 2012-08-30 – 2012-08-31 (×2): 300 mg via ORAL
  Filled 2012-08-30 (×2): qty 1

## 2012-08-30 MED ORDER — SODIUM CHLORIDE 0.9 % IV SOLN
INTRAVENOUS | Status: AC
  Administered 2012-08-30: 04:00:00 via INTRAVENOUS

## 2012-08-30 MED ORDER — ENOXAPARIN SODIUM 40 MG/0.4ML ~~LOC~~ SOLN
40.0000 mg | Freq: Every day | SUBCUTANEOUS | Status: DC
  Administered 2012-08-30: 40 mg via SUBCUTANEOUS
  Filled 2012-08-30 (×2): qty 0.4

## 2012-08-30 MED ORDER — SODIUM CHLORIDE 0.9 % IV SOLN: INTRAVENOUS | Status: DC

## 2012-08-30 MED ORDER — ASPIRIN EC 81 MG PO TBEC
81.0000 mg | DELAYED_RELEASE_TABLET | Freq: Every day | ORAL | Status: DC
Start: 2012-08-30 — End: 2012-08-31
  Administered 2012-08-30 – 2012-08-31 (×2): 81 mg via ORAL
  Filled 2012-08-30 (×2): qty 1

## 2012-08-30 MED ORDER — INSULIN NPH (HUMAN) (ISOPHANE) 100 UNIT/ML ~~LOC~~ SUSP
13.0000 [IU] | Freq: Every day | SUBCUTANEOUS | Status: DC
  Administered 2012-08-30 – 2012-08-31 (×2): 13 [IU] via SUBCUTANEOUS
  Filled 2012-08-30: qty 10

## 2012-08-30 MED ORDER — VANCOMYCIN HCL IN DEXTROSE 1-5 GM/200ML-% IV SOLN
1000.0000 mg | Freq: Once | INTRAVENOUS | Status: AC
  Administered 2012-08-30: 1000 mg via INTRAVENOUS
  Filled 2012-08-30: qty 200

## 2012-08-30 MED ORDER — MORPHINE SULFATE 2 MG/ML IJ SOLN: 1.0000 mg | INTRAMUSCULAR | Status: DC | PRN

## 2012-08-30 MED ORDER — NEBIVOLOL HCL 2.5 MG PO TABS
2.5000 mg | ORAL_TABLET | Freq: Every day | ORAL | Status: DC
  Administered 2012-08-30 – 2012-08-31 (×2): 2.5 mg via ORAL
  Filled 2012-08-30 (×2): qty 1

## 2012-08-30 MED ORDER — OXYCODONE-ACETAMINOPHEN 5-325 MG PO TABS: 1.0000 | ORAL_TABLET | ORAL | Status: DC | PRN

## 2012-08-30 MED ORDER — INSULIN NPH (HUMAN) (ISOPHANE) 100 UNIT/ML ~~LOC~~ SUSP
10.0000 [IU] | Freq: Every day | SUBCUTANEOUS | Status: DC
  Administered 2012-08-30: 10 [IU] via SUBCUTANEOUS

## 2012-08-30 MED ORDER — INSULIN ASPART 100 UNIT/ML ~~LOC~~ SOLN
0.0000 [IU] | Freq: Three times a day (TID) | SUBCUTANEOUS | Status: DC
  Administered 2012-08-30: 3 [IU] via SUBCUTANEOUS
  Administered 2012-08-30: 2 [IU] via SUBCUTANEOUS
  Administered 2012-08-30: 1 [IU] via SUBCUTANEOUS

## 2012-08-30 MED ORDER — TETANUS-DIPHTH-ACELL PERTUSSIS 5-2.5-18.5 LF-MCG/0.5 IM SUSP
0.5000 mL | Freq: Once | INTRAMUSCULAR | Status: AC
  Administered 2012-08-30: 0.5 mL via INTRAMUSCULAR
  Filled 2012-08-30: qty 0.5

## 2012-08-30 MED ORDER — ONDANSETRON HCL 4 MG PO TABS: 4.0000 mg | ORAL_TABLET | Freq: Four times a day (QID) | ORAL | Status: DC | PRN

## 2012-08-30 MED ORDER — LEVOTHYROXINE SODIUM 50 MCG PO TABS
50.0000 ug | ORAL_TABLET | Freq: Every day | ORAL | Status: DC
  Administered 2012-08-30 – 2012-08-31 (×2): 50 ug via ORAL
  Filled 2012-08-30 (×3): qty 1

## 2012-08-30 NOTE — H&P (Signed)
[facility] History and Physical  Tom Ragsdale RUE:[id number] DOB: 1920/10/06 DOA: 08/29/2012  Referring physician: ED physician PCP: Allean Found, MD   Chief Complaint: left leg pain  HPI:  Pt is 79 yo female who presents to [facility] ED with main concern of progressively worsening left lower extremity erythema and edema, pain, intermittent and sharp, 5/10 in severity and non radiating, started 2- 3 day prior to this admission after pt fell and injured her leg. She denies any specific associated symptoms such as fever, chills, numbness or tingling, no alleviating or aggravating factors.  In ED, pt noted to have left LE swelling and erythema consistent with cellulitis, [facility] asked to admit for further evaluation.  Assessment and Plan: Left lower extremity cellulitis - will admit to medical floor - start on IV abx Vancomycin and taper down in 1-2 days as clinically indicated - continue to provide gentle hydration as started in ED, analgesia as needed - PT evaluation may be needed prior to discharge  - keep extremity elevated Diabetes mellitus - continue insulin per home regimen and add SSI Hypothyroidism - continue synthroid   Code Status: Full Family Communication: Pt at bedside Disposition Plan: Admit to medical floor   Review of Systems:  Constitutional: Negative for fever, chills and malaise/fatigue. Negative for diaphoresis.  HENT: Negative for hearing loss, ear pain, nosebleeds, congestion, sore throat, neck pain, tinnitus and ear discharge.   Eyes: Negative for blurred vision, double vision, photophobia, pain, discharge and redness.  Respiratory: Negative for cough, hemoptysis, sputum production, shortness of breath, wheezing and stridor.   Cardiovascular: Negative for chest pain, palpitations, orthopnea, claudication and leg swelling.  Gastrointestinal: Negative for nausea, vomiting and abdominal pain. Negative for heartburn, constipation, blood in stool and melena.   Genitourinary: Negative for dysuria, urgency, frequency, hematuria and flank pain.  Musculoskeletal: Negative for myalgias, back pain.  Skin: Negative for itching and rash.  Neurological: Negative for tingling, tremors, sensory change, speech change, focal weakness, loss of consciousness and headaches.  Endo/Heme/Allergies: Negative for environmental allergies and polydipsia. Does not bruise/bleed easily.  Psychiatric/Behavioral: Negative for suicidal ideas. The patient is not nervous/anxious.      Past Medical History  Diagnosis Date  . Diabetes mellitus   . Hypertension   . Thyroid disease   . Hypothyroidism   . Blood transfusion 1952    Bld Transfusion w/C/S  - [facility], [place]  . Seasonal allergies     no meds  . Arthritis     shoulder, back    Past Surgical History  Procedure Laterality Date  . Eye surgery      bilateral - cataract surgery  . Svd      x 1    Social History:  reports that she has never smoked. She has never used smokeless tobacco. She reports that she does not drink alcohol or use illicit drugs.  Allergies  Allergen Reactions  . Penicillins Hives    No family medical history  Prior to Admission medications   Medication Sig Start Date End Date Taking? Authorizing Provider  aspirin 81 MG EC tablet Take 81 mg by mouth daily. Swallow whole.   Yes Historical Provider, MD  insulin NPH (HUMULIN N,NOVOLIN N) 100 UNIT/ML injection Inject 10-13 Units into the skin 2 (two) times daily. 13 units in the morning and 10 units in the evening   Yes Historical Provider, MD  irbesartan (AVAPRO) 300 MG tablet Take 300 mg by mouth daily.   Yes Historical Provider,  MD  levothyroxine (SYNTHROID, LEVOTHROID) 50 MCG tablet Take 50 mcg by mouth daily.   Yes Historical Provider, MD  nebivolol (BYSTOLIC) 2.5 MG tablet Take 2.5 mg by mouth daily.   Yes Historical Provider, MD    Physical Exam: Filed Vitals:   08/29/12 2219 08/30/12 0243 08/30/12 0305  BP:  185/87 155/55 161/72  Pulse: 74 74 62  Temp: 97.7 F (36.5 C) 97.8 F (36.6 C) 98.4 F (36.9 C)  TempSrc: Oral Oral Oral  Resp: 20 17 18   Weight: 61.236 kg (135 lb)    SpO2: 98% 98% 98%    Physical Exam  Constitutional: Appears well-developed and well-nourished. No distress.  HENT: Normocephalic. External right and left ear normal. Oropharynx is clear and moist.  Eyes: Conjunctivae and EOM are normal. PERRLA, no scleral icterus.  Neck: Normal ROM. Neck supple. No JVD. No tracheal deviation. No thyromegaly.  CVS: RRR, S1/S2 +, no murmurs, no gallops, no carotid bruit.  Pulmonary: Effort and breath sounds normal, no stridor, rhonchi, wheezes, rales.  Abdominal: Soft. BS +,  no distension, tenderness, rebound or guarding.  Musculoskeletal: Normal range of motion. Left lower extremity erythema and edema with tenderness to palpation  Lymphadenopathy: No lymphadenopathy noted, cervical, inguinal. Neuro: Alert. Normal reflexes, muscle tone coordination. No cranial nerve deficit. Skin: Skin is warm and dry. No rash noted. Not diaphoretic. No erythema. No pallor.  Psychiatric: Normal mood and affect. Behavior, judgment, thought content normal.   Labs on Admission:  Basic Metabolic Panel:  Recent Labs Lab 08/29/12 2310  NA 139  K 3.9  CL 101  CO2 33*  GLUCOSE 212*  BUN 30*  CREATININE 0.84  CALCIUM 9.2   CBC:  Recent Labs Lab 08/29/12 2310  WBC 5.4  NEUTROABS 3.3  HGB 12.2  HCT 36.1  MCV 93.8  PLT 147*    CBG:  Recent Labs Lab 08/29/12 2350  GLUCAP 180*    Radiological Exams on Admission: Dg Lumbar Spine Complete  08/30/2012   *RADIOLOGY REPORT*  Clinical Data: Chronic back pain with recent fall.  LUMBAR SPINE - COMPLETE 4+ VIEW  Comparison: None.  Findings: Moderate to severe multilevel lumbar spondylosis. Osteophytic spurring is present at every level.  There is mild wedging of the L1 vertebra which is age indeterminant.  There are no features to suggest an  acute compression fracture however difficult to exclude.  Consider follow-up MRI in the setting of recent fall.  Multilevel foraminal encroachment associated with posterior endplate spurring.  No pars defects. There appears to be ankylosis of the lower thoracic vertebrae.  This may extend L1.  IMPRESSION:  1.  No definite acute osseous abnormality. 2.  Age indeterminant but likely chronic L1 compression fracture. Routine follow-up MRI may be useful for assessment of the age of the L1 fracture.   Original Report Authenticated By: Andreas Newport, M.D.   Dg Pelvis 1-2 Views  08/30/2012   *RADIOLOGY REPORT*  Clinical Data: Pelvic pain.  Back pain.  Fall.  PELVIS - 1-2 VIEW  Comparison: None.  Findings: Grossly, the sacral arcades appear intact.  SI joint degenerative disease.  Possible ankylosis of the left SI joint. Moderate to severe bilateral hip osteoarthritis.  Osteopenia.  The obturator rings appear intact.  No femoral neck fracture. Atherosclerosis of the aorta, iliac and femoral system.  IMPRESSION: Degenerative changes without acute osseous abnormality.   Original Report Authenticated By: Andreas Newport, M.D.   Dg Elbow Complete Left  08/30/2012   *RADIOLOGY REPORT*  Clinical Data: Fall.  Posterior elbow pain.  LEFT ELBOW - COMPLETE 3+ VIEW  Comparison: None.  Findings: No effusion.  No fracture.  Radial head appears intact. Mild osteopenia.  Enthesopathic calcification at the common flexor and common extensor origins.  IMPRESSION: No acute abnormality.   Original Report Authenticated By: Andreas Newport, M.D.   Dg Tibia/fibula Left  08/30/2012   *RADIOLOGY REPORT*  Clinical Data: Left leg pain, swelling and redness.  LEFT TIBIA AND FIBULA - 2 VIEW  Comparison: None.  Findings: Diffuse infiltration of the subcutaneous tissues of the left leg are present.  Osteopenia.  No osteolysis.  No fracture or radiopaque foreign body.  Calcaneal spurs incidentally noted.  IMPRESSION: Subcutaneous infiltration most  compatible with cellulitis or dependent edema.   Original Report Authenticated By: Andreas Newport, M.D.   Dg Foot Complete Left  08/30/2012   *RADIOLOGY REPORT*  Clinical Data: Fall.  Left foot pain.  LEFT FOOT - COMPLETE 3+ VIEW  Comparison: None.  Findings: Hallux valgus is present.  Moderate to severe left first MTP joint osteoarthritis.  No fracture is identified.  Osteopenia. Midfoot osteoarthritis.  Small vessel atherosclerosis.  Calcaneal spurs incidentally noted.  IMPRESSION: No acute abnormality.   Original Report Authenticated By: Andreas Newport, M.D.    EKG: Normal sinus rhythm, no ST/T wave changes  Debbora Presto, MD  [facility] Pager [phone]  If 7PM-7AM, please contact night-coverage [email or web address] Password [facility] 08/30/2012, 3:14 AM

## 2012-08-30 NOTE — Progress Notes (Addendum)
[facility] PROGRESS NOTE  Kari Ford ZOX:[id number] DOB: Apr 12, 1920 DOA: 08/29/2012 PCP: Allean Found, MD  Brief narrative: 79 yo female with HTN, DM, hypothyroidism came to [facility] ED with progressively worsening left lower extremity erythema and edema, pain, intermittent and sharp, 5/10 in severity and non radiating, started 2- 3 day prior to this admission after pt fell and injured her leg.   Assessment/Plan: LLE cellulitis Started on IV vancomycin on admission . Pain control.  No signs of fracture on LE x ray.  PT eval  DM continue home dose NPH. SSI  Hypothyroidism continue synthroid  HTN  stable. Continue bystolic and irbesartan  Hypokalemia  replenish with kcl   Code Status: full Family Communication: none at bedside Disposition Plan: home once improved in 1-2 days   Consultants: none Procedures:  none  Antibiotics: IV vanco ( 7/4>>)  HPI/Subjective: Admission H&P reviewed.. Reports leg pain better with medications   Objective: Filed Vitals:   08/29/12 2219 08/30/12 0243 08/30/12 0305 08/30/12 0556  BP: 185/87 155/55 161/72 144/63  Pulse: 74 74 62 59  Temp: 97.7 F (36.5 C) 97.8 F (36.6 C) 98.4 F (36.9 C) 97.6 F (36.4 C)  TempSrc: Oral Oral Oral Oral  Resp: 20 17 18 14   Height:   5\' 4"  (1.626 m)   Weight: 61.236 kg (135 lb)  61.236 kg (135 lb)   SpO2: 98% 98% 98% 98%    Intake/Output Summary (Last 24 hours) at 08/30/12 0854 Last data filed at 08/30/12 0319  Gross per 24 hour  Intake      0 ml  Output      1 ml  Net     -1 ml   Filed Weights   08/29/12 2219 08/30/12 0305  Weight: 61.236 kg (135 lb) 61.236 kg (135 lb)    Exam:   General:  Elderly female in NAD  HHEENT: no pallor, moist mucosa  Chest: clear b/l, no added sounds  Cardiovascular: NS1&S2, no murmurs  Abdomen: soft, NT, ND BS+  Musculoskeletal: erythema with warmth and swelling over LLE, non tender  CNS: AAOX3  Data Reviewed: Basic Metabolic  Panel:  Recent Labs Lab 08/29/12 2310 08/30/12 0440  NA 139 140  K 3.9 3.4*  CL 101 105  CO2 33* 30  GLUCOSE 212* 207*  BUN 30* 25*  CREATININE 0.84 0.70  CALCIUM 9.2 8.5   Liver Function Tests: No results found for this basename: AST, ALT, ALKPHOS, BILITOT, PROT, ALBUMIN,  in the last 168 hours No results found for this basename: LIPASE, AMYLASE,  in the last 168 hours No results found for this basename: AMMONIA,  in the last 168 hours CBC:  Recent Labs Lab 08/29/12 2310 08/30/12 0440  WBC 5.4 5.2  NEUTROABS 3.3  --   HGB 12.2 10.9*  HCT 36.1 32.1*  MCV 93.8 93.9  PLT 147* 141*   Cardiac Enzymes: No results found for this basename: CKTOTAL, CKMB, CKMBINDEX, TROPONINI,  in the last 168 hours BNP (last 3 results) No results found for this basename: PROBNP,  in the last 8760 hours CBG:  Recent Labs Lab 08/29/12 2350 08/30/12 0719  GLUCAP 180* 162*    No results found for this or any previous visit (from the past 240 hour(s)).   Studies: Dg Lumbar Spine Complete  08/30/2012   *RADIOLOGY REPORT*  Clinical Data: Chronic back pain with recent fall.  LUMBAR SPINE - COMPLETE 4+ VIEW  Comparison: None.  Findings: Moderate to severe multilevel lumbar spondylosis. Osteophytic  spurring is present at every level.  There is mild wedging of the L1 vertebra which is age indeterminant.  There are no features to suggest an acute compression fracture however difficult to exclude.  Consider follow-up MRI in the setting of recent fall.  Multilevel foraminal encroachment associated with posterior endplate spurring.  No pars defects. There appears to be ankylosis of the lower thoracic vertebrae.  This may extend L1.  IMPRESSION:  1.  No definite acute osseous abnormality. 2.  Age indeterminant but likely chronic L1 compression fracture. Routine follow-up MRI may be useful for assessment of the age of the L1 fracture.   Original Report Authenticated By: Andreas Newport, M.D.   Dg Pelvis 1-2  Views  08/30/2012   *RADIOLOGY REPORT*  Clinical Data: Pelvic pain.  Back pain.  Fall.  PELVIS - 1-2 VIEW  Comparison: None.  Findings: Grossly, the sacral arcades appear intact.  SI joint degenerative disease.  Possible ankylosis of the left SI joint. Moderate to severe bilateral hip osteoarthritis.  Osteopenia.  The obturator rings appear intact.  No femoral neck fracture. Atherosclerosis of the aorta, iliac and femoral system.  IMPRESSION: Degenerative changes without acute osseous abnormality.   Original Report Authenticated By: Andreas Newport, M.D.   Dg Elbow Complete Left  08/30/2012   *RADIOLOGY REPORT*  Clinical Data: Fall.  Posterior elbow pain.  LEFT ELBOW - COMPLETE 3+ VIEW  Comparison: None.  Findings: No effusion.  No fracture.  Radial head appears intact. Mild osteopenia.  Enthesopathic calcification at the common flexor and common extensor origins.  IMPRESSION: No acute abnormality.   Original Report Authenticated By: Andreas Newport, M.D.   Dg Tibia/fibula Left  08/30/2012   *RADIOLOGY REPORT*  Clinical Data: Left leg pain, swelling and redness.  LEFT TIBIA AND FIBULA - 2 VIEW  Comparison: None.  Findings: Diffuse infiltration of the subcutaneous tissues of the left leg are present.  Osteopenia.  No osteolysis.  No fracture or radiopaque foreign body.  Calcaneal spurs incidentally noted.  IMPRESSION: Subcutaneous infiltration most compatible with cellulitis or dependent edema.   Original Report Authenticated By: Andreas Newport, M.D.   Dg Foot Complete Left  08/30/2012   *RADIOLOGY REPORT*  Clinical Data: Fall.  Left foot pain.  LEFT FOOT - COMPLETE 3+ VIEW  Comparison: None.  Findings: Hallux valgus is present.  Moderate to severe left first MTP joint osteoarthritis.  No fracture is identified.  Osteopenia. Midfoot osteoarthritis.  Small vessel atherosclerosis.  Calcaneal spurs incidentally noted.  IMPRESSION: No acute abnormality.   Original Report Authenticated By: Andreas Newport, M.D.     Scheduled Meds: . aspirin EC  81 mg Oral Daily  . enoxaparin (LOVENOX) injection  40 mg Subcutaneous Daily  . insulin aspart  0-9 Units Subcutaneous TID WC  . insulin NPH  10 Units Subcutaneous QHS  . insulin NPH  13 Units Subcutaneous QAC breakfast  . irbesartan  300 mg Oral Daily  . levothyroxine  50 mcg Oral Daily  . nebivolol  2.5 mg Oral Daily  . vancomycin  1,250 mg Intravenous Q24H   Continuous Infusions: . sodium chloride 50 mL/hr at 08/30/12 0342      Time spent: 20 minutes    Montgomery Rothlisberger  [facility] Pager [phone]. If 7PM-7AM, please contact night-coverage at [email or web address], password [facility] 08/30/2012, 8:54 AM  LOS: 1 day

## 2012-08-30 NOTE — Evaluation (Signed)
Physical Therapy Evaluation Patient Details Name: Kari Ford MRN: [id number] DOB: 07-21-1920 Today's Date: 08/30/2012 Time: 7829-5621 PT Time Calculation (min): 34 min  PT Assessment / Plan / Recommendation History of Present Illness  79 yo female with HTN, DM, hypothyroidism came to [facility] ED with progressively worsening left lower extremity erythema and edema, pain, intermittent and sharp, 5/10 in severity and non radiating, started 2- 3 day prior to this admission after pt fell and injured her leg.   Clinical Impression  Pt is extremely unsafe and at risk for continued falls; She would benefit from at the very least HHPT (vs SNF) however she becomes very upset when this is mentioned by PT; She resides in a 2 level home with her bedroom being upstairs; her daughter works part-time; Pt gets upset and cites financial issues when asked about life alert or other safety measures; No family present at time of eval; Will see again tomorrow as she will benefit from PT due to deficitis below. Recommend OT consult.    PT Assessment  Patient needs continued PT services    Follow Up Recommendations  SNF;Home health PT;Supervision/Assistance - 24 hour (pt will not agree to any f/u therapy at this time)    Does the patient have the potential to tolerate intense rehabilitation      Barriers to Discharge Decreased caregiver support;Inaccessible home environment pt bedroom is upstairs;     Equipment Recommendations  None recommended by PT    Recommendations for Other Services     Frequency Min 3X/week    Precautions / Restrictions     Pertinent Vitals/Pain       Mobility  Bed Mobility Bed Mobility: Not assessed Details for Bed Mobility Assistance: pt on BSC;  Transfers Transfers: Sit to Stand;Stand to Sit Sit to Stand: 4: Min assist;From chair/3-in-1 Stand to Sit: 4: Min assist;To chair/3-in-1 Details for Transfer Assistance: multi-modal cues for safety and hand placement; pt attempts to  sit before reaching chair even with cues  Ambulation/Gait Ambulation/Gait Assistance: 4: Min assist;3: Mod assist Ambulation Distance (Feet): 100 Feet Assistive device: Rolling walker Ambulation/Gait Assistance Details: pt requires assist and multi-modal cues for RW distance from self, and steering/maneuvering RW, she repeatedly runs in to obstacles on L side Gait Pattern: Step-through pattern;Trunk flexed;Decreased stride length Stairs: No    Exercises     PT Diagnosis: Difficulty walking  PT Problem List: Decreased strength;Decreased range of motion;Decreased activity tolerance;Decreased balance;Decreased mobility;Decreased safety awareness;Decreased knowledge of use of DME PT Treatment Interventions: Gait training;Functional mobility training;DME instruction;Therapeutic activities;Therapeutic exercise;Balance training;Stair training;Patient/family education     PT Goals(Current goals can be found in the care plan section) Acute Rehab PT Goals Patient Stated Goal: home tomorrow PT Goal Formulation: With patient Time For Goal Achievement: 09/13/12 Potential to Achieve Goals: Good  Visit Information  Last PT Received On: 08/30/12 Assistance Needed: +1 History of Present Illness: 79 yo female with HTN, DM, hypothyroidism came to [facility] ED with progressively worsening left lower extremity erythema and edema, pain, intermittent and sharp, 5/10 in severity and non radiating, started 2- 3 day prior to this admission after pt fell and injured her leg.        Prior Functioning  Home Living Family/patient expects to be discharged to:: Private residence Living Arrangements: Children Available Help at Discharge: Family Type of Home: House Home Access: Stairs to enter Secretary/administrator of Steps: 3-4 Entrance Stairs-Rails: Right Home Layout: Two level Alternate Level Stairs-Number of Steps: 12  Alternate Level Stairs-Rails: Right  Home Equipment: Bedside commode;Cane - single  point;Walker - 2 wheels Additional Comments: lives with daughter;  she works a few hrs a day per pt; pt repots she has to go slowly; Pt reports she showers ususally but sponge bathes sometimes; Prior Function Level of Independence: Independent with assistive device(s) Communication Communication: No difficulties Dominant Hand: Right    Cognition  Cognition Arousal/Alertness: Awake/alert Behavior During Therapy: WFL for tasks assessed/performed Overall Cognitive Status: Within Functional Limits for tasks assessed    Extremity/Trunk Assessment Upper Extremity Assessment Upper Extremity Assessment: RUE deficits/detail RUE Deficits / Details: AAROM shoulder flexion and abduction to 90*, then painful; Pt reports lots of difficulty with RUE;  R elbow ext 2/5; flexion 3/5; LUE grossly WFL; Lower Extremity Assessment Lower Extremity Assessment: Generalized weakness;LLE deficits/detail (LLE edematous; dopplers pending; grossly 3/5) LLE Deficits / Details: LLE edematous; grossly 3/5   Balance Static Standing Balance Static Standing - Balance Support: Bilateral upper extremity supported Static Standing - Level of Assistance: 5: Stand by assistance;4: Min assist  End of Session PT - End of Session Equipment Utilized During Treatment: Gait belt Activity Tolerance: Patient tolerated treatment well Patient left: in chair;with call bell/phone within reach;with chair alarm set Nurse Communication: Mobility status  GP     [facility] 08/30/2012, 2:45 PM

## 2012-08-30 NOTE — Progress Notes (Signed)
Utilization review completed.  

## 2012-08-30 NOTE — ED Provider Notes (Signed)
History    CSN: [id number] Arrival date & time 08/29/12  2118  First MD Initiated Contact with Patient 08/29/12 2343     Chief Complaint  Patient presents with  . Leg Swelling    Patient is a 79 y.o. female presenting with leg pain. The history is provided by the patient and a relative.  Leg Pain Location:  Leg Time since incident:  3 days Injury: yes   Leg location:  L leg Pain details:    Quality:  Aching   Radiates to:  Does not radiate   Severity:  Mild   Onset quality:  Sudden   Timing:  Constant   Progression:  Worsening Chronicity:  New Tetanus status:  Unknown Relieved by:  Rest Exacerbated by: movement. Associated symptoms: back pain   Associated symptoms: no fever, no muscle weakness and no neck pain   Pt presents from home She lives with daughter and is usually high functioning She had mechanical fall earlier this week (she tripped due to bedroom slippers) and sustained left elbow abrasion and left knee injury.  No LOC or significant head injury Since then she has had increased pain in left LE and swelling  She did have recent drops in glucose last week but has been fine since then  Past Medical History  Diagnosis Date  . Diabetes mellitus   . Hypertension   . Thyroid disease   . Hypothyroidism   . Blood transfusion 1952    Bld Transfusion w/C/S  - [facility] -[place], [place]  . Seasonal allergies     no meds  . Arthritis     shoulder, back   Past Surgical History  Procedure Laterality Date  . Eye surgery      bilateral - cataract surgery  . Svd      x 1   No family history on file. History  Substance Use Topics  . Smoking status: Never Smoker   . Smokeless tobacco: Never Used  . Alcohol Use: No   OB History   Grav Para Term Preterm Abortions TAB SAB Ect Mult Living                 Review of Systems  Constitutional: Negative for fever.  HENT: Negative for neck pain.   Respiratory: Negative for shortness of breath.     Cardiovascular: Negative for chest pain.  Gastrointestinal: Negative for vomiting and abdominal pain.  Genitourinary: Positive for dysuria.  Musculoskeletal: Positive for myalgias, back pain and joint swelling.  Skin: Positive for color change.  Neurological: Negative for weakness and headaches.  Psychiatric/Behavioral: Negative for agitation.  All other systems reviewed and are negative.    Allergies  Penicillins  Home Medications   Current Outpatient Rx  Name  Route  Sig  Dispense  Refill  . aspirin 81 MG EC tablet   Oral   Take 81 mg by mouth daily. Swallow whole.         . insulin NPH (HUMULIN N,NOVOLIN N) 100 UNIT/ML injection   Subcutaneous   Inject 10-18 Units into the skin 2 (two) times daily. 18 units in the morning and 10 units in the evening         . irbesartan (AVAPRO) 300 MG tablet   Oral   Take 300 mg by mouth daily.         Marland Kitchen levothyroxine (SYNTHROID, LEVOTHROID) 50 MCG tablet   Oral   Take 50 mcg by mouth daily.         Marland Kitchen  nebivolol (BYSTOLIC) 2.5 MG tablet   Oral   Take 2.5 mg by mouth daily.          BP 185/87  Pulse 74  Temp(Src) 97.7 F (36.5 C) (Oral)  Resp 20  Wt 135 lb (61.236 kg)  BMI 25.52 kg/m2  SpO2 98% Physical Exam CONSTITUTIONAL: Well developed/well nourished HEAD: Normocephalic/atraumatic EYES: EOMI/PERRL ENMT: Mucous membranes moist, poor dentition but no acute signs of trauma to nose/teeth.  Small brusing to left periorbital region but no crepitance and no tenderness NECK: supple no meningeal signs SPINE:lumbar spine tenderness.  No cervical or thoracic spine tenderness.  No bruising/crepitance/stepoffs noted to spine CV: S1/S2 noted LUNGS: Lungs are clear to auscultation bilaterally, no apparent distress Chest - nontender to palpation ABDOMEN: soft, nontender, no rebound or guarding GU:no cva tenderness NEURO: Pt is awake/alert, moves all extremitiesx4 EXTREMITIES: see left LE picture below.  Distal pulses equal  via doppler.  There is no crepitance/drainage to the leg.  The left  Calf is soft.   Small abrasion to left elbow with mild tenderness with ROM.   All other extremities/joints palpated/ranged and nontender She reports pain her groin with ROM of each hip SKIN: warm, color normal PSYCH: no abnormalities of mood noted      ED Course  Procedures  Labs Reviewed  CBC WITH DIFFERENTIAL - Abnormal; Notable for the following:    RBC 3.85 (*)    Platelets 147 (*)    All other components within normal limits  BASIC METABOLIC PANEL - Abnormal; Notable for the following:    CO2 33 (*)    Glucose, Bld 212 (*)    BUN 30 (*)    GFR calc non Af Amer 60 (*)    GFR calc Af Amer 69 (*)    All other components within normal limits  GLUCOSE, CAPILLARY - Abnormal; Notable for the following:    Glucose-Capillary 180 (*)    All other components within normal limits   12:43 AM Pt stable, imaging ordered 2:12 AM Will admit for probable cellulitis and likely will need US imaging of left LE D/w dr Izola Price to admit for obs Pt agreeable  MDM  Nursing notes including past medical history and social history reviewed and considered in documentation xrays reviewed and considered Labs/vital reviewed and considered     Date: 08/30/2012  Rate: 69  Rhythm: normal sinus rhythm  QRS Axis: left  Intervals: normal  ST/T Wave abnormalities: nonspecific ST changes  Conduction Disutrbances:first-degree A-V block   Narrative Interpretation:   Old EKG Reviewed: unchanged from 05/2011     Joya Gaskins, MD 08/30/12 [phone]

## 2012-08-30 NOTE — Progress Notes (Signed)
ANTIBIOTIC CONSULT NOTE - INITIAL  Pharmacy Consult for Vancomycin Indication: LLE cellulitis  Allergies  Allergen Reactions  . Penicillins Hives    Patient Measurements: Weight: 135 lb (61.236 kg)   Vital Signs: Temp: 98.4 F (36.9 C) (07/04 0305) Temp src: Oral (07/04 0305) BP: 161/72 mmHg (07/04 0305) Pulse Rate: 62 (07/04 0305) Intake/Output from previous day:   Intake/Output from this shift:    Labs:  Recent Labs  08/29/12 2310  WBC 5.4  HGB 12.2  PLT 147*  CREATININE 0.84   The CrCl is unknown because both a height and weight (above a minimum accepted value) are required for this calculation. No results found for this basename: VANCOTROUGH, VANCOPEAK, VANCORANDOM, GENTTROUGH, GENTPEAK, GENTRANDOM, TOBRATROUGH, TOBRAPEAK, TOBRARND, AMIKACINPEAK, AMIKACINTROU, AMIKACIN,  in the last 72 hours   Microbiology: No results found for this or any previous visit (from the past 720 hour(s)).  Medical History: Past Medical History  Diagnosis Date  . Diabetes mellitus   . Hypertension   . Thyroid disease   . Hypothyroidism   . Blood transfusion 1952    Bld Transfusion w/C/S  - [facility], [place]  . Seasonal allergies     no meds  . Arthritis     shoulder, back    Medications:  Scheduled:  . aspirin EC  81 mg Oral Daily  . enoxaparin (LOVENOX) injection  40 mg Subcutaneous Daily  . insulin aspart  0-9 Units Subcutaneous TID WC  . insulin NPH  10 Units Subcutaneous QHS  . insulin NPH  13 Units Subcutaneous QAC breakfast  . irbesartan  300 mg Oral Daily  . levothyroxine  50 mcg Oral Daily  . nebivolol  2.5 mg Oral Daily  . vancomycin  1,250 mg Intravenous Q24H   Infusions:  . sodium chloride     Assessment: 79 yo admitted with worsening LLE erythema/edema and pain.  Vancomycin for cellulitis.  Goal of Therapy:  Vancomycin trough level 10-15 mcg/ml  Plan:   Vancomycin 1Gm x1 in ER @ 0230, then 1250mg  IV q24h. CrCl~43 (N)  F/U SCr/levels  as needed.  Susanne Greenhouse R 08/30/2012,3:32 AM

## 2012-08-30 NOTE — Progress Notes (Signed)
Nutrition Brief Note  Patient identified on the Malnutrition Screening Tool (MST) Report  Body mass index is 23.16 kg/(m^2). Patient meets criteria for wnl based on current BMI.   Wt Readings from Last 10 Encounters:  08/30/12 135 lb (61.236 kg)  06/08/11 160 lb (72.576 kg)     Current diet order is regular, patient is consuming approximately 100% of meals at this time. Labs and medications reviewed.   Patient lives with daughter and reports good intake.  Follows diabetic diet at home.  Tolerating meals currently with good intake.  Drank Glucerna at times at home but does not want any here.  Patient denies any recent weight loss but 15% weight loss in the last 15 months per chart.  Assisted patient with meal orders.  No nutrition interventions warranted at this time. If nutrition issues arise, please consult RD.   Oran Rein, RD, LDN Clinical Inpatient Dietitian Pager:  [phone] Weekend and after hours pager:  [phone]

## 2012-08-30 NOTE — Progress Notes (Signed)
VASCULAR LAB PRELIMINARY  PRELIMINARY  PRELIMINARY  PRELIMINARY  Left lower extremity venous Doppler completed.    Preliminary report:  There is no DVT or SVT noted in the left lower extremity.  Wing Gfeller, RVT 08/30/2012, 3:40 PM

## 2012-08-31 DIAGNOSIS — E119 Type 2 diabetes mellitus without complications: Secondary | ICD-10-CM

## 2012-08-31 DIAGNOSIS — E876 Hypokalemia: Secondary | ICD-10-CM

## 2012-08-31 DIAGNOSIS — I10 Essential (primary) hypertension: Secondary | ICD-10-CM

## 2012-08-31 LAB — GLUCOSE, CAPILLARY: Glucose-Capillary: 105 mg/dL — ABNORMAL HIGH (ref 70–99)

## 2012-08-31 MED ORDER — HYDROCODONE-ACETAMINOPHEN 5-325 MG PO TABS: 1.0000 | ORAL_TABLET | ORAL | Status: DC | PRN

## 2012-08-31 MED ORDER — DOXYCYCLINE HYCLATE 100 MG PO TABS: 100.0000 mg | ORAL_TABLET | Freq: Two times a day (BID) | ORAL | Status: DC

## 2012-08-31 NOTE — Care Management (Addendum)
   CARE MANAGEMENT NOTE 08/31/2012  Patient:  Kari Ford, Kari Ford   Account Number:  [id number]  Date Initiated:  08/31/2012  Documentation initiated by:  Karoline Caldwell  Subjective/Objective Assessment:   Kari Ford is a 79 year old female, admitted inpatient for Cellulitis of leg, left. Discharge orders on file.     Action/Plan:   Discharge orders on file. This CM spoke with Kari Ford and her daughter Kari Ford.  Her daughter Kari Ford will be assisting her when she is discharged home.HHC order for RN and PT.   Anticipated DC Date:  08/31/2012   Anticipated DC Plan:  HOME W HOME HEALTH SERVICES         Choice offered to / List presented to:  Kari Ford and her daughter Kari Ford work# [phone], or cell # [id number].  Patient and family chose [facility]           Status of service:  inpatient being discharged to home Medicare Important Message given?   (If response is "NO", the following Medicare IM given date fields will be blank) Date Medicare IM given:   Date Additional Medicare IM given:    Discharge Disposition:    Per UR Regulation:    If discussed at Long Length of Stay Meetings, dates discussed:    Comments: The patient's daughter stated Dr. Katrinka Blazing sends a RN, that checks on her mother sometimes, Kari Ford wasn't sure with what agency she thinks it's [facility]. [facility] notified and will contact the patient.        Karoline Caldwell, RN, BSN,BS   [phone]

## 2012-08-31 NOTE — Discharge Summary (Signed)
Physician Discharge Summary  Kari Ford ZOX:[id number] DOB: 1920-05-03 DOA: 08/29/2012  PCP: Allean Found, MD  Admit date: 08/29/2012 Discharge date: 08/31/2012  Time spent: 40 minutes  Recommendations for Outpatient Follow-up:  1. Home with HHPT. Follow up with PCP in 1 week  Discharge Diagnoses:   Principal Problem:   Cellulitis of leg, left  Active Problems:   Type II or unspecified type diabetes mellitus without mention of complication, not stated as uncontrolled   Essential hypertension, benign   Unspecified hypothyroidism   Hypokalemia   Discharge Condition:fair  Diet recommendation: diabetic  Filed Weights   08/29/12 2219 08/30/12 0305  Weight: 61.236 kg (135 lb) 61.236 kg (135 lb)    History of present illness:  Please refer to admission H&P for details, but in brief,79 yo female with HTN, DM, hypothyroidism came to [facility] ED with progressively worsening left lower extremity erythema and edema, pain, intermittent and sharp, 5/10 in severity and non radiating, started 2- 3 day prior to this admission after pt fell and injured her leg.   Hospital Course:   LLE cellulitis  Started on IV vancomycin on admission . Pain control.  No signs of fracture on LE x ray. Doppler LE negative for DVT.  PT evaluated patient and recommended SNF vs HHPT with 24 hr supervision. patient prefers to go home and says she has enough help at home.  Patient's cellulitis has markedly improved. i will discharge her on oral doxycycline 100 mg bid to complete a 10 days course.   DM  continue home dose NPH.  Hypothyroidism  continue synthroid   HTN  stable. Continue bystolic and irbesartan   Hypokalemia  replenished with kcl   Code Status: full  Family Communication: none at bedside  Disposition Plan: home with HHPT  Consultants:  none  Procedures:  none Antibiotics:  IV vanco ( 7/4>>)  Discharge on oral doxycycline until 7/12     Discharge Exam: Filed Vitals:   08/30/12 1420 08/30/12 1500 08/30/12 2100 08/31/12 0520  BP: 192/61 168/64 145/65 153/78  Pulse: 61  63 73  Temp: 98.1 F (36.7 C)  97.9 F (36.6 C) 97.9 F (36.6 C)  TempSrc:   Oral Oral  Resp: 18  18 16   Height:      Weight:      SpO2: 98%  98% 97%   General: Elderly female in NAD  HHEENT: no pallor, moist mucosa  Chest: clear b/l, no added sounds  Cardiovascular: NS1&S2, no murmurs  Abdomen: soft, NT, ND BS+  Musculoskeletal:markedly improved  erythema and  swelling over LLE, non tender  CNS: AAOX3    Discharge Instructions     Medication List         aspirin 81 MG EC tablet  Take 81 mg by mouth daily. Swallow whole.     doxycycline 100 MG tablet  Commonly known as:  VIBRA-TABS  Take 1 tablet (100 mg total) by mouth 2 (two) times daily. ( until 7/12)     HYDROcodone-acetaminophen 5-325 MG per tablet  Commonly known as:  NORCO/VICODIN  Take 1 tablet by mouth every 4 (four) hours as needed.     insulin NPH 100 UNIT/ML injection  Commonly known as:  HUMULIN N,NOVOLIN N  Inject 10-13 Units into the skin 2 (two) times daily. 13 units in the morning and 10 units in the evening     irbesartan 300 MG tablet  Commonly known as:  AVAPRO  Take 300 mg by mouth daily.  levothyroxine 50 MCG tablet  Commonly known as:  SYNTHROID, LEVOTHROID  Take 50 mcg by mouth daily.     nebivolol 2.5 MG tablet  Commonly known as:  BYSTOLIC  Take 2.5 mg by mouth daily.       Allergies  Allergen Reactions  . Penicillins Hives       Follow-up Information   Follow up with Allean Found, MD In 1 week.   Contact information:   [address] [place] [address] [phone]        The results of significant diagnostics from this hospitalization (including imaging, microbiology, ancillary and laboratory) are listed below for reference.    Significant Diagnostic Studies: Dg Lumbar Spine Complete  08/30/2012   *RADIOLOGY REPORT*  Clinical Data: Chronic back  pain with recent fall.  LUMBAR SPINE - COMPLETE 4+ VIEW  Comparison: None.  Findings: Moderate to severe multilevel lumbar spondylosis. Osteophytic spurring is present at every level.  There is mild wedging of the L1 vertebra which is age indeterminant.  There are no features to suggest an acute compression fracture however difficult to exclude.  Consider follow-up MRI in the setting of recent fall.  Multilevel foraminal encroachment associated with posterior endplate spurring.  No pars defects. There appears to be ankylosis of the lower thoracic vertebrae.  This may extend L1.  IMPRESSION:  1.  No definite acute osseous abnormality. 2.  Age indeterminant but likely chronic L1 compression fracture. Routine follow-up MRI may be useful for assessment of the age of the L1 fracture.   Original Report Authenticated By: Andreas Newport, M.D.   Dg Pelvis 1-2 Views  08/30/2012   *RADIOLOGY REPORT*  Clinical Data: Pelvic pain.  Back pain.  Fall.  PELVIS - 1-2 VIEW  Comparison: None.  Findings: Grossly, the sacral arcades appear intact.  SI joint degenerative disease.  Possible ankylosis of the left SI joint. Moderate to severe bilateral hip osteoarthritis.  Osteopenia.  The obturator rings appear intact.  No femoral neck fracture. Atherosclerosis of the aorta, iliac and femoral system.  IMPRESSION: Degenerative changes without acute osseous abnormality.   Original Report Authenticated By: Andreas Newport, M.D.   Dg Elbow Complete Left  08/30/2012   *RADIOLOGY REPORT*  Clinical Data: Fall.  Posterior elbow pain.  LEFT ELBOW - COMPLETE 3+ VIEW  Comparison: None.  Findings: No effusion.  No fracture.  Radial head appears intact. Mild osteopenia.  Enthesopathic calcification at the common flexor and common extensor origins.  IMPRESSION: No acute abnormality.   Original Report Authenticated By: Andreas Newport, M.D.   Dg Tibia/fibula Left  08/30/2012   *RADIOLOGY REPORT*  Clinical Data: Left leg pain, swelling and redness.   LEFT TIBIA AND FIBULA - 2 VIEW  Comparison: None.  Findings: Diffuse infiltration of the subcutaneous tissues of the left leg are present.  Osteopenia.  No osteolysis.  No fracture or radiopaque foreign body.  Calcaneal spurs incidentally noted.  IMPRESSION: Subcutaneous infiltration most compatible with cellulitis or dependent edema.   Original Report Authenticated By: Andreas Newport, M.D.   Dg Foot Complete Left  08/30/2012   *RADIOLOGY REPORT*  Clinical Data: Fall.  Left foot pain.  LEFT FOOT - COMPLETE 3+ VIEW  Comparison: None.  Findings: Hallux valgus is present.  Moderate to severe left first MTP joint osteoarthritis.  No fracture is identified.  Osteopenia. Midfoot osteoarthritis.  Small vessel atherosclerosis.  Calcaneal spurs incidentally noted.  IMPRESSION: No acute abnormality.   Original Report Authenticated By: Andreas Newport, M.D.  Microbiology: No results found for this or any previous visit (from the past 240 hour(s)).   Labs: Basic Metabolic Panel:  Recent Labs Lab 08/29/12 2310 08/30/12 0440  NA 139 140  K 3.9 3.4*  CL 101 105  CO2 33* 30  GLUCOSE 212* 207*  BUN 30* 25*  CREATININE 0.84 0.70  CALCIUM 9.2 8.5   Liver Function Tests: No results found for this basename: AST, ALT, ALKPHOS, BILITOT, PROT, ALBUMIN,  in the last 168 hours No results found for this basename: LIPASE, AMYLASE,  in the last 168 hours No results found for this basename: AMMONIA,  in the last 168 hours CBC:  Recent Labs Lab 08/29/12 2310 08/30/12 0440  WBC 5.4 5.2  NEUTROABS 3.3  --   HGB 12.2 10.9*  HCT 36.1 32.1*  MCV 93.8 93.9  PLT 147* 141*   Cardiac Enzymes: No results found for this basename: CKTOTAL, CKMB, CKMBINDEX, TROPONINI,  in the last 168 hours BNP: BNP (last 3 results) No results found for this basename: PROBNP,  in the last 8760 hours CBG:  Recent Labs Lab 08/30/12 0719 08/30/12 1150 08/30/12 1718 08/30/12 2129 08/31/12 0710  GLUCAP 162* 128* 215* 176*  105*       Signed:  Holley Kocurek  [facility] 08/31/2012, 9:16 AM

## 2012-08-31 NOTE — Progress Notes (Signed)
Physical Therapy Treatment Patient Details Name: Kari Ford MRN: [id number] DOB: June 05, 1920 Today's Date: 08/31/2012 Time: 4098-1191 PT Time Calculation (min): 26 min  PT Assessment / Plan / Recommendation  PT Comments   Pt with slightly better gait stability today; Would continue to recommend supervision for OOB for pt safety as she is a continued fall risk.   Follow Up Recommendations  Home health PT;Supervision for mobility/OOB     Does the patient have the potential to tolerate intense rehabilitation     Barriers to Discharge        Equipment Recommendations  None recommended by PT    Recommendations for Other Services    Frequency Min 3X/week   Progress towards PT Goals Progress towards PT goals: Progressing toward goals  Plan Current plan remains appropriate    Precautions / Restrictions Precautions Precautions: Fall Restrictions Weight Bearing Restrictions: No   Pertinent Vitals/Pain     Mobility  Bed Mobility Bed Mobility: Not assessed Transfers Transfers: Sit to Stand;Stand to Sit Sit to Stand: 5: Supervision Stand to Sit: 5: Supervision;To chair/3-in-1;With upper extremity assist Details for Transfer Assistance: verbal cues for safety and hand placement Ambulation/Gait Ambulation/Gait Assistance: 4: Min guard;4: Min assist Ambulation Distance (Feet): 110 Feet Assistive device: Rolling walker;None;1 person hand held assist Ambulation/Gait Assistance Details: RW ht lowered to increased pt comfort/decrease pain at shoulders; pt able to demo safer technique with lower walker ht/able to walk inside walker more; continues to require cues for safety with turns and min assist with balance intermittently Gait Pattern: Step-through pattern;Trunk flexed;Decreased stride length General Gait Details: pt does admit to frequent furniture walking  at home; pt grandson educated on adjusting RW ht  Stairs: Yes Stairs Assistance: 4: Min assist;4: Min Armed forces training and education officer Details (indicate cue type and reason): min to min/guard for balance; pt reports at this time that she has 2 rails on stairs at home, previously reported different set up Stair Management Technique: Two rails;Forwards Number of Stairs: 4    Exercises     PT Diagnosis:    PT Problem List:   PT Treatment Interventions:     PT Goals (current goals can now be found in the care plan section) Acute Rehab PT Goals Patient Stated Goal: go home Time For Goal Achievement: 09/13/12 Potential to Achieve Goals: Good  Visit Information  Last PT Received On: 08/31/12 Assistance Needed: +1 History of Present Illness: 79 yo female with HTN, DM, hypothyroidism came to [facility] ED with progressively worsening left lower extremity erythema and edema, pain, intermittent and sharp, 5/10 in severity and non radiating, started 2- 3 day prior to this admission after pt fell and injured her leg.     Subjective Data  Patient Stated Goal: go home   Cognition  Cognition Arousal/Alertness: Awake/alert Behavior During Therapy: Anxious Overall Cognitive Status: Difficult to assess Current Attention Level: Sustained Memory: Decreased recall of precautions;Decreased short-term memory Safety/Judgement: Decreased awareness of safety;Decreased awareness of deficits Problem Solving: Requires verbal cues General Comments: pt with decreased insight into deficits; tangential at times and requires frequent re-direction to task    Balance     End of Session PT - End of Session Equipment Utilized During Treatment: Gait belt Activity Tolerance: Patient tolerated treatment well Patient left: in chair;with call bell/phone within reach;with family/visitor present;Other (comment) (OT)   GP     [facility] 08/31/2012, 11:31 AM

## 2012-08-31 NOTE — Discharge Summary (Signed)
Pt. Was discharged home with her grandson. Discharge instructions and prescriptions were given to the grandson with the pt. Present. Pt. Was transported home by grandson.

## 2012-08-31 NOTE — Evaluation (Signed)
Occupational Therapy Evaluation Patient Details Name: Kari Ford MRN: [id number] DOB: Jul 06, 1920 Today's Date: 08/31/2012 Time: 4098-1191 OT Time Calculation (min): 54 min  OT Assessment / Plan / Recommendation History of present illness 79 yo female with HTN, DM, hypothyroidism came to [facility] ED with progressively worsening left lower extremity erythema and edema, pain, intermittent and sharp, 5/10 in severity and non radiating, started 2- 3 day prior to this admission after pt fell and injured her leg.    Clinical Impression   Pt. Seen for one time eval and recommend HHOT for follow up.    OT Assessment  All further OT needs can be met in the next venue of care    Follow Up Recommendations  Home health OT    Barriers to Discharge      Equipment Recommendations  None recommended by OT    Recommendations for Other Services    Frequency       Precautions / Restrictions Precautions Precautions: Fall Restrictions Weight Bearing Restrictions: No   Pertinent Vitals/Pain     ADL  Grooming: Performed;Wash/dry hands;Wash/dry face;Brushing hair Where Assessed - Grooming: Unsupported sitting Upper Body Dressing: Performed;Minimal assistance Where Assessed - Upper Body Dressing: Unsupported sitting Lower Body Dressing: Performed;Maximal assistance Where Assessed - Lower Body Dressing: Unsupported sitting Toilet Transfer: Min guard Toilet Transfer Method: Surveyor, minerals: Bedside commode Toileting - Clothing Manipulation and Hygiene: Moderate assistance Where Assessed - Toileting Clothing Manipulation and Hygiene: Standing ADL Comments:  (Pt. was receiving assist with LE ADL's from dtr. )    OT Diagnosis:    OT Problem List:   OT Treatment Interventions:     OT Goals(Current goals can be found in the care plan section) Acute Rehab OT Goals Patient Stated Goal: go home  Visit Information  Last OT Received On: 08/31/12 Assistance Needed: +1 PT/OT  Co-Evaluation/Treatment: Yes History of Present Illness: 79 yo female with HTN, DM, hypothyroidism came to [facility] ED with progressively worsening left lower extremity erythema and edema, pain, intermittent and sharp, 5/10 in severity and non radiating, started 2- 3 day prior to this admission after pt fell and injured her leg.        Prior Functioning     Home Living Family/patient expects to be discharged to:: Private residence Living Arrangements: Children Available Help at Discharge: Available PRN/intermittently;Family Type of Home: House Home Access: Stairs to enter Home Equipment: Bedside commode;Cane - single point;Walker - 2 wheels Additional Comments: lives with daughter;  she works a few hrs a day per pt; pt repots she has to go slowly; Pt reports she showers ususally but sponge bathes sometimes; Prior Function Level of Independence: Needs assistance ADL's / Homemaking Assistance Needed:  (requires assist with ADL's and mobility)         Vision/Perception Vision - History Baseline Vision: No visual deficits   Cognition  Cognition Arousal/Alertness: Awake/alert Behavior During Therapy: Anxious Overall Cognitive Status: Difficult to assess Current Attention Level: Sustained Memory: Decreased recall of precautions;Decreased short-term memory Safety/Judgement: Decreased awareness of safety;Decreased awareness of deficits Problem Solving: Requires verbal cues General Comments: pt with decreased insight into deficits; tangential at times and requires frequent re-direction to task    Extremity/Trunk Assessment Upper Extremity Assessment Upper Extremity Assessment: Overall WFL for tasks assessed RUE Deficits / Details:  (strength 3+/5)     Mobility Bed Mobility Bed Mobility: Not assessed Transfers Sit to Stand: 5: Supervision Stand to Sit: 5: Supervision;To chair/3-in-1;With upper extremity assist Details for Transfer Assistance: verbal  cues for safety and hand placement      Exercise     Balance     End of Session OT - End of Session Equipment Utilized During Treatment: Rolling walker Activity Tolerance: Patient limited by fatigue Patient left: in chair;with call bell/phone within reach;with chair alarm set;with family/visitor present  GO     Kari Ford 08/31/2012, 12:43 PM

## 2012-09-02 NOTE — Progress Notes (Signed)
Discharge summary sent to payer through [employer]  

## 2013-02-24 ENCOUNTER — Emergency Department (HOSPITAL_COMMUNITY)
Admission: EM | Admit: 2013-02-24 | Discharge: 2013-02-24 | Disposition: A | Discharge status: Home / Self Care | Payer: Medicare PPO | Attending: Emergency Medicine | Admitting: Emergency Medicine

## 2013-02-24 ENCOUNTER — Emergency Department (HOSPITAL_COMMUNITY): Payer: Medicare PPO

## 2013-02-24 ENCOUNTER — Encounter (HOSPITAL_COMMUNITY): Payer: Self-pay | Admitting: Emergency Medicine

## 2013-02-24 DIAGNOSIS — E1169 Type 2 diabetes mellitus with other specified complication: Secondary | ICD-10-CM | POA: Insufficient documentation

## 2013-02-24 DIAGNOSIS — I10 Essential (primary) hypertension: Secondary | ICD-10-CM | POA: Insufficient documentation

## 2013-02-24 DIAGNOSIS — S51809A Unspecified open wound of unspecified forearm, initial encounter: Secondary | ICD-10-CM | POA: Insufficient documentation

## 2013-02-24 DIAGNOSIS — E039 Hypothyroidism, unspecified: Secondary | ICD-10-CM | POA: Insufficient documentation

## 2013-02-24 DIAGNOSIS — Z79899 Other long term (current) drug therapy: Secondary | ICD-10-CM | POA: Insufficient documentation

## 2013-02-24 DIAGNOSIS — M19019 Primary osteoarthritis, unspecified shoulder: Secondary | ICD-10-CM | POA: Insufficient documentation

## 2013-02-24 DIAGNOSIS — Z7982 Long term (current) use of aspirin: Secondary | ICD-10-CM | POA: Insufficient documentation

## 2013-02-24 DIAGNOSIS — S0101XA Laceration without foreign body of scalp, initial encounter: Secondary | ICD-10-CM

## 2013-02-24 DIAGNOSIS — Z794 Long term (current) use of insulin: Secondary | ICD-10-CM | POA: Insufficient documentation

## 2013-02-24 DIAGNOSIS — W19XXXA Unspecified fall, initial encounter: Secondary | ICD-10-CM

## 2013-02-24 DIAGNOSIS — M129 Arthropathy, unspecified: Secondary | ICD-10-CM | POA: Insufficient documentation

## 2013-02-24 DIAGNOSIS — Y929 Unspecified place or not applicable: Secondary | ICD-10-CM | POA: Insufficient documentation

## 2013-02-24 DIAGNOSIS — S0100XA Unspecified open wound of scalp, initial encounter: Secondary | ICD-10-CM | POA: Insufficient documentation

## 2013-02-24 DIAGNOSIS — Y9389 Activity, other specified: Secondary | ICD-10-CM | POA: Insufficient documentation

## 2013-02-24 DIAGNOSIS — S51812A Laceration without foreign body of left forearm, initial encounter: Secondary | ICD-10-CM

## 2013-02-24 DIAGNOSIS — T68XXXA Hypothermia, initial encounter: Secondary | ICD-10-CM | POA: Insufficient documentation

## 2013-02-24 DIAGNOSIS — E162 Hypoglycemia, unspecified: Secondary | ICD-10-CM

## 2013-02-24 DIAGNOSIS — Z880 Allergy status to penicillin: Secondary | ICD-10-CM | POA: Insufficient documentation

## 2013-02-24 DIAGNOSIS — W07XXXA Fall from chair, initial encounter: Secondary | ICD-10-CM | POA: Insufficient documentation

## 2013-02-24 LAB — CBC WITH DIFFERENTIAL/PLATELET
Basophils Absolute: 0 10*3/uL (ref 0.0–0.1)
Basophils Relative: 0 % (ref 0–1)
Lymphocytes Relative: 9 % — ABNORMAL LOW (ref 12–46)
MCHC: 35 g/dL (ref 30.0–36.0)
Neutro Abs: 5.9 10*3/uL (ref 1.7–7.7)
Neutrophils Relative %: 83 % — ABNORMAL HIGH (ref 43–77)
Platelets: 181 10*3/uL (ref 150–400)
RDW: 14.3 % (ref 11.5–15.5)
WBC: 7.1 10*3/uL (ref 4.0–10.5)

## 2013-02-24 LAB — BASIC METABOLIC PANEL
CO2: 28 mEq/L (ref 19–32)
Chloride: 102 mEq/L (ref 96–112)
Creatinine, Ser: 0.8 mg/dL (ref 0.50–1.10)
GFR calc Af Amer: 73 mL/min — ABNORMAL LOW (ref >90)
Potassium: 4.1 mEq/L (ref 3.5–5.1)
Sodium: 140 mEq/L (ref 135–145)

## 2013-02-24 LAB — URINALYSIS, ROUTINE W REFLEX MICROSCOPIC
Glucose, UA: 500 mg/dL — AB
Hgb urine dipstick: NEGATIVE
Ketones, ur: NEGATIVE mg/dL
Leukocytes, UA: NEGATIVE
Protein, ur: 100 mg/dL — AB
pH: 5 (ref 5.0–8.0)

## 2013-02-24 LAB — URINE MICROSCOPIC-ADD ON

## 2013-02-24 LAB — GLUCOSE, CAPILLARY: Glucose-Capillary: 68 mg/dL — ABNORMAL LOW (ref 70–99)

## 2013-02-24 MED ORDER — DEXTROSE 50 % IV SOLN
12.5000 g | Freq: Once | INTRAVENOUS | Status: AC
  Administered 2013-02-24: 12.5 g via INTRAVENOUS
  Filled 2013-02-24: qty 50

## 2013-02-24 MED ORDER — FENTANYL CITRATE 0.05 MG/ML IJ SOLN
50.0000 ug | Freq: Once | INTRAMUSCULAR | Status: AC
  Administered 2013-02-24: 50 ug via INTRAVENOUS
  Filled 2013-02-24: qty 2

## 2013-02-24 NOTE — ED Notes (Signed)
Pt fell out of a chair while reaching for phone. Pt decreased LOC, family called EMS. CBG initially 41, given instant glucose 24gm PO with increase CBG 79.  Pt c/o low back pain. Noted lac to forehead & skin tear LUE.

## 2013-02-24 NOTE — ED Provider Notes (Signed)
CSN: [id number]     Arrival date & time 02/24/13  9562 History   First MD Initiated Contact with Patient 02/24/13 [phone]     Chief Complaint  Patient presents with  . Hypoglycemia  . Fall   (Consider location/radiation/quality/duration/timing/severity/associated sxs/prior Treatment) Patient is a 79 y.o. female presenting with hypoglycemia and fall.  Hypoglycemia Fall   Pt with history of DM on NPH 10units BID found on floor by daughter who is her primary care giver. Patient had been reaching for the phone and fell forward out of her chair. She had eaten some breakfast this AM, but per daughter had not taken her morning insulin. Found to be hypoglycemic by EMS and given oral glucose. She is improved mentation now, but complaining of neck and back pain, moderate aching worse with movement. No complaints per EMS so not immobilized for transport.   Past Medical History  Diagnosis Date  . Diabetes mellitus   . Hypertension   . Thyroid disease   . Hypothyroidism   . Blood transfusion 1952    Bld Transfusion w/C/S  - [facility] [place]  . Seasonal allergies     no meds  . Arthritis     shoulder, back   Past Surgical History  Procedure Laterality Date  . Eye surgery      bilateral - cataract surgery  . Svd      x 1   No family history on file. History  Substance Use Topics  . Smoking status: Never Smoker   . Smokeless tobacco: Never Used  . Alcohol Use: No   OB History   Grav Para Term Preterm Abortions TAB SAB Ect Mult Living                 Review of Systems All other systems reviewed and are negative except as noted in HPI.   Allergies  Penicillins  Home Medications   Current Outpatient Rx  Name  Route  Sig  Dispense  Refill  . aspirin 81 MG EC tablet   Oral   Take 81 mg by mouth daily. Swallow whole.         Marland Kitchen doxycycline (VIBRA-TABS) 100 MG tablet   Oral   Take 1 tablet (100 mg total) by mouth 2 (two) times daily.   16 tablet   0   .  HYDROcodone-acetaminophen (NORCO/VICODIN) 5-325 MG per tablet   Oral   Take 1 tablet by mouth every 4 (four) hours as needed.   20 tablet   0   . insulin NPH (HUMULIN N,NOVOLIN N) 100 UNIT/ML injection   Subcutaneous   Inject 10-13 Units into the skin 2 (two) times daily. 13 units in the morning and 10 units in the evening         . irbesartan (AVAPRO) 300 MG tablet   Oral   Take 300 mg by mouth daily.         Marland Kitchen levothyroxine (SYNTHROID, LEVOTHROID) 50 MCG tablet   Oral   Take 50 mcg by mouth daily.         . nebivolol (BYSTOLIC) 2.5 MG tablet   Oral   Take 2.5 mg by mouth daily.          BP 190/48  Pulse 66  Temp(Src) 92.9 F (33.8 C) (Rectal)  SpO2 99% Physical Exam  Nursing note and vitals reviewed. Constitutional: She is oriented to person, place, and time. She appears well-developed and well-nourished.  HENT:  Head: Normocephalic.  Laceration  L frontal scalp  Eyes: EOM are normal. Pupils are equal, round, and reactive to light.  Neck: Normal range of motion. Neck supple.  Cardiovascular: Normal rate, normal heart sounds and intact distal pulses.   Pulmonary/Chest: Effort normal and breath sounds normal.  Abdominal: Bowel sounds are normal. She exhibits no distension. There is no tenderness.  Musculoskeletal: Normal range of motion. She exhibits no edema and no tenderness.  Neurological: She is alert and oriented to person, place, and time. She has normal strength. No cranial nerve deficit or sensory deficit.  Skin: Skin is warm and dry. No rash noted.  Skin tear L forearm  Psychiatric: She has a normal mood and affect.    ED Course  Procedures (including critical care time)  LACERATION REPAIR Performed by: Pollyann Savoy. Consent: Verbal consent obtained. Risks and benefits: risks, benefits and alternatives were discussed Patient identity confirmed: provided demographic data Time out performed prior to procedure Prepped and Draped in normal  sterile fashion Wound explored Laceration Location: L frontal scalp Laceration Length: 3cm No Foreign Bodies seen or palpated Anesthesia: local infiltration Local anesthetic: lidocaine 1% no epinephrine Anesthetic total: 3 ml Irrigation method: syringe Amount of cleaning: standard Skin closure: staples Number of sutures or staples: 3 Technique: staples Patient tolerance: Patient tolerated the procedure well with no immediate complications.  LACERATION REPAIR Performed by: Pollyann Savoy. Consent: Verbal consent obtained. Risks and benefits: risks, benefits and alternatives were discussed Patient identity confirmed: provided demographic data Time out performed prior to procedure Prepped and Draped in normal sterile fashion Wound explored Laceration Location: L forearm, skin tear Laceration Length: 4cm No Foreign Bodies seen or palpated Irrigation method: syringe Amount of cleaning: standard Skin closure:steri-strips Patient tolerance: Patient tolerated the procedure well with no immediate complications.    Labs Review Labs Reviewed  GLUCOSE, CAPILLARY - Abnormal; Notable for the following:    Glucose-Capillary 68 (*)    All other components within normal limits  CBC WITH DIFFERENTIAL - Abnormal; Notable for the following:    Neutrophils Relative % 83 (*)    Lymphocytes Relative 9 (*)    All other components within normal limits  BASIC METABOLIC PANEL - Abnormal; Notable for the following:    Glucose, Bld 235 (*)    BUN 29 (*)    GFR calc non Af Amer 63 (*)    GFR calc Af Amer 73 (*)    All other components within normal limits  URINALYSIS, ROUTINE W REFLEX MICROSCOPIC - Abnormal; Notable for the following:    Glucose, UA 500 (*)    Bilirubin Urine SMALL (*)    Protein, ur 100 (*)    All other components within normal limits  GLUCOSE, CAPILLARY - Abnormal; Notable for the following:    Glucose-Capillary 215 (*)    All other components within normal limits   URINE MICROSCOPIC-ADD ON   Imaging Review Dg Lumbar Spine 2-3 Views  02/24/2013   CLINICAL DATA:  Larey Seat.  Back pain.  EXAM: LUMBAR SPINE - 2-3 VIEW  COMPARISON:  08/30/2012  FINDINGS: Advanced degenerative lumbar spondylosis with multilevel disc disease and facet disease. No obvious acute bony findings or destructive bony changes. Remote compression deformities of the lower thoracic spine and L1. No definite change when compared to prior examination. Extensive aortic calcifications are again noted.  IMPRESSION: Severe degenerative lumbar spondylosis with multilevel disc disease and facet disease.  No definite acute fractures.   Electronically Signed   By: Loralie Champagne M.D.   On: 02/24/2013  10:34   Ct Head Wo Contrast  02/24/2013   CLINICAL DATA:  78 year old female status post fall with decreased mental status. Hypoglycemia. Pain. Initial encounter.  EXAM: CT HEAD WITHOUT CONTRAST  CT CERVICAL SPINE WITHOUT CONTRAST  TECHNIQUE: Multidetector CT imaging of the head and cervical spine was performed following the standard protocol without intravenous contrast. Multiplanar CT image reconstructions of the cervical spine were also generated.  COMPARISON:  None.  FINDINGS: CT HEAD FINDINGS  Postoperative changes to the globes. Otherwise negative orbits soft tissues. Vertex scalp laceration with subcutaneous gas and indistinct associated soft tissue contusion or hematoma. Underlying calvarium intact. Visualized paranasal sinuses and mastoids are clear.  Calcified atherosclerosis at the skull base. Perisylvian cerebral volume loss. No ventriculomegaly. Calcified 12 mm right middle cranial fossa extra-axial lesion compatible with small meningioma. No significant mass effect. No evidence of cerebral edema. Nonspecific periventricular white matter hypodensity. No other intracranial mass lesion. No significant intracranial mass effect. No evidence of cortically based acute infarction identified. No acute  intracranial hemorrhage identified. No suspicious intracranial vascular hyperdensity.  CT CERVICAL SPINE FINDINGS  Osteopenia. Visualized skull base is intact. No atlanto-occipital dissociation. Degenerative ligamentous hypertrophy about the odontoid. Degenerative appearing C3-C4 posterior element ankylosis on the left. Associated facet arthropathy at C2-C3 and C4-C5. Advanced disc and endplate degeneration at C5-C6 and C6-C7. Cervicothoracic junction alignment is within normal limits. Bilateral posterior element alignment is within normal limits. Multifactorial spinal stenosis at C5-C6 appears to be moderate. No acute cervical spine fracture identified.  Grossly intact visualized upper thoracic levels. Negative lung apices. Small inconsequential hypodense thyroid nodules. Calcified cervical carotid atherosclerosis greater on the right. Otherwise negative paraspinal soft tissues.  IMPRESSION: 1.  Scalp soft tissue injury without underlying fracture.  2. No acute intracranial abnormality.  3. No acute fracture or listhesis identified in the cervical spine. Ligamentous injury is not excluded.  4. Degenerative spinal changes with moderate appearing degenerative spinal stenosis at C5-C6.   Electronically Signed   By: Augusto Gamble M.D.   On: 02/24/2013 11:00   Ct Cervical Spine Wo Contrast  02/24/2013   CLINICAL DATA:  79 year old female status post fall with decreased mental status. Hypoglycemia. Pain. Initial encounter.  EXAM: CT HEAD WITHOUT CONTRAST  CT CERVICAL SPINE WITHOUT CONTRAST  TECHNIQUE: Multidetector CT imaging of the head and cervical spine was performed following the standard protocol without intravenous contrast. Multiplanar CT image reconstructions of the cervical spine were also generated.  COMPARISON:  None.  FINDINGS: CT HEAD FINDINGS  Postoperative changes to the globes. Otherwise negative orbits soft tissues. Vertex scalp laceration with subcutaneous gas and indistinct associated soft tissue  contusion or hematoma. Underlying calvarium intact. Visualized paranasal sinuses and mastoids are clear.  Calcified atherosclerosis at the skull base. Perisylvian cerebral volume loss. No ventriculomegaly. Calcified 12 mm right middle cranial fossa extra-axial lesion compatible with small meningioma. No significant mass effect. No evidence of cerebral edema. Nonspecific periventricular white matter hypodensity. No other intracranial mass lesion. No significant intracranial mass effect. No evidence of cortically based acute infarction identified. No acute intracranial hemorrhage identified. No suspicious intracranial vascular hyperdensity.  CT CERVICAL SPINE FINDINGS  Osteopenia. Visualized skull base is intact. No atlanto-occipital dissociation. Degenerative ligamentous hypertrophy about the odontoid. Degenerative appearing C3-C4 posterior element ankylosis on the left. Associated facet arthropathy at C2-C3 and C4-C5. Advanced disc and endplate degeneration at C5-C6 and C6-C7. Cervicothoracic junction alignment is within normal limits. Bilateral posterior element alignment is within normal limits. Multifactorial spinal stenosis at C5-C6 appears  to be moderate. No acute cervical spine fracture identified.  Grossly intact visualized upper thoracic levels. Negative lung apices. Small inconsequential hypodense thyroid nodules. Calcified cervical carotid atherosclerosis greater on the right. Otherwise negative paraspinal soft tissues.  IMPRESSION: 1.  Scalp soft tissue injury without underlying fracture.  2. No acute intracranial abnormality.  3. No acute fracture or listhesis identified in the cervical spine. Ligamentous injury is not excluded.  4. Degenerative spinal changes with moderate appearing degenerative spinal stenosis at C5-C6.   Electronically Signed   By: Augusto Gamble M.D.   On: 02/24/2013 11:00    EKG Interpretation   None       MDM   1. Hypoglycemia   2. Hypothermia, initial encounter   3. Fall,  initial encounter   4. Scalp laceration, initial encounter   5. Skin tear of forearm without complication, left, initial encounter     Labs and imaging reviewed and unremarkable. Pt still complaining of lower back pain, worse with sitting up but improved with pain meds and able to ambulate with assistance at baseline and ready to go home. Daughter advised to discuss insulin regimen with PCP prior to reinitiating doses today.    Gurbani Figge B. Bernette Mayers, MD 02/24/13 1311

## 2013-02-24 NOTE — ED Notes (Signed)
Patient unable to sit up. Attempted twice, but stated that the pain was too much for her to stand.

## 2013-02-24 NOTE — ED Notes (Signed)
Patient transported to X-ray / CT scan. 

## 2013-02-24 NOTE — ED Notes (Signed)
Phillie collar placed, tolerated well.

## 2013-02-24 NOTE — ED Notes (Signed)
Pt ambulated to door and back to bed; pt c/o a little amount of pain compared to attempting to get out of bed previously; pt family member stated that her ambulation was normal and that she looked fine.  Pt ambulates at home with walker.

## 2013-06-02 ENCOUNTER — Emergency Department (HOSPITAL_COMMUNITY)
Admission: EM | Admit: 2013-06-02 | Discharge: 2013-06-02 | Disposition: A | Discharge status: Home / Self Care | Payer: Medicare PPO | Attending: Emergency Medicine | Admitting: Emergency Medicine

## 2013-06-02 ENCOUNTER — Encounter (HOSPITAL_COMMUNITY): Payer: Self-pay | Admitting: Emergency Medicine

## 2013-06-02 DIAGNOSIS — Z79899 Other long term (current) drug therapy: Secondary | ICD-10-CM | POA: Insufficient documentation

## 2013-06-02 DIAGNOSIS — Z8739 Personal history of other diseases of the musculoskeletal system and connective tissue: Secondary | ICD-10-CM | POA: Insufficient documentation

## 2013-06-02 DIAGNOSIS — E119 Type 2 diabetes mellitus without complications: Secondary | ICD-10-CM | POA: Insufficient documentation

## 2013-06-02 DIAGNOSIS — I872 Venous insufficiency (chronic) (peripheral): Secondary | ICD-10-CM

## 2013-06-02 DIAGNOSIS — L03116 Cellulitis of left lower limb: Secondary | ICD-10-CM

## 2013-06-02 DIAGNOSIS — L03119 Cellulitis of unspecified part of limb: Principal | ICD-10-CM

## 2013-06-02 DIAGNOSIS — Z794 Long term (current) use of insulin: Secondary | ICD-10-CM | POA: Insufficient documentation

## 2013-06-02 DIAGNOSIS — Z880 Allergy status to penicillin: Secondary | ICD-10-CM | POA: Insufficient documentation

## 2013-06-02 DIAGNOSIS — I10 Essential (primary) hypertension: Secondary | ICD-10-CM | POA: Insufficient documentation

## 2013-06-02 DIAGNOSIS — L02419 Cutaneous abscess of limb, unspecified: Secondary | ICD-10-CM | POA: Insufficient documentation

## 2013-06-02 DIAGNOSIS — E039 Hypothyroidism, unspecified: Secondary | ICD-10-CM | POA: Insufficient documentation

## 2013-06-02 MED ORDER — HEPARIN BOLUS VIA INFUSION
3000.0000 [IU] | Freq: Once | INTRAVENOUS | Status: DC
  Filled 2013-06-02: qty 3000

## 2013-06-02 MED ORDER — SULFAMETHOXAZOLE-TRIMETHOPRIM 800-160 MG PO TABS: 1.0000 | ORAL_TABLET | Freq: Two times a day (BID) | ORAL | Status: AC

## 2013-06-02 MED ORDER — HEPARIN (PORCINE) IN NACL 100-0.45 UNIT/ML-% IJ SOLN
950.0000 [IU]/h | INTRAMUSCULAR | Status: DC
  Filled 2013-06-02: qty 250

## 2013-06-02 MED ORDER — SULFAMETHOXAZOLE-TMP DS 800-160 MG PO TABS
1.0000 | ORAL_TABLET | Freq: Once | ORAL | Status: AC
  Administered 2013-06-02: 1 via ORAL
  Filled 2013-06-02: qty 1

## 2013-06-02 NOTE — Progress Notes (Deleted)
ANTICOAGULATION CONSULT NOTE - Initial Consult  Pharmacy Consult for Heparin Indication: R/o PE   Allergies  Allergen Reactions  . Penicillins Hives    Patient Measurements: Height: 5\' 3"  (160 cm) Weight: 137 lb (62.143 kg) IBW/kg (Calculated) : 52.4 Heparin Dosing Weight: n/a  Vital Signs: Temp: 97.5 F (36.4 C) (04/06 0932) Temp src: Oral (04/06 0932) BP: 178/46 mmHg (04/06 0953) Pulse Rate: 64 (04/06 0953)  Labs: No results found for this basename: HGB, HCT, PLT, APTT, LABPROT, INR, HEPARINUNFRC, CREATININE, CKTOTAL, CKMB, TROPONINI,  in the last 72 hours  Estimated Creatinine Clearance: 38.7 ml/min (by C-G formula based on Cr of 0.8).   Medical History: Past Medical History  Diagnosis Date  . Diabetes mellitus   . Hypertension   . Thyroid disease   . Hypothyroidism   . Blood transfusion 1952    Bld Transfusion w/C/S  - [facility] [place]  . Seasonal allergies     no meds  . Arthritis     shoulder, back    Medications:   (Not in a hospital admission)  Assessment: [age] YOF with r/o to start heparin infusion. Stat CBC ordered. Not on any anticoagulation PTA.   Goal of Therapy:  Heparin level 0.3-0.7 units/ml Monitor platelets by anticoagulation protocol: Yes   Plan:  Give 3000 units bolus x 1 Start heparin infusion at 950 units/hr Check anti-Xa level in 8 hours and daily while on heparin F/u H&H and platelets  Vinnie LevelBenjamin Meklit Cotta, PharmD.  Clinical Pharmacist Pager [phone]

## 2013-06-02 NOTE — ED Notes (Signed)
Brought pt to room via wheelchair from triage; pt undressed, in gown, on continuous pulse oximetry and blood pressure cuff; daughter at bedside

## 2013-06-02 NOTE — Discharge Instructions (Signed)
Cellulitis Cellulitis is an infection of the skin and the tissue beneath it. The infected area is usually red and tender. Cellulitis occurs most often in the arms and lower legs.  CAUSES  Cellulitis is caused by bacteria that enter the skin through cracks or cuts in the skin. The most common types of bacteria that cause cellulitis are Staphylococcus and Streptococcus. SYMPTOMS   Redness and warmth.  Swelling.  Tenderness or pain.  Fever. DIAGNOSIS  Your caregiver can usually determine what is wrong based on a physical exam. Blood tests may also be done. TREATMENT  Treatment usually involves taking an antibiotic medicine. HOME CARE INSTRUCTIONS   Take your antibiotics as directed. Finish them even if you start to feel better.  Keep the infected arm or leg elevated to reduce swelling.  Apply a warm cloth to the affected area up to 4 times per day to relieve pain.  Only take over-the-counter or prescription medicines for pain, discomfort, or fever as directed by your caregiver.  Keep all follow-up appointments as directed by your caregiver. SEEK MEDICAL CARE IF:   You notice red streaks coming from the infected area.  Your red area gets larger or turns dark in color.  Your bone or joint underneath the infected area becomes painful after the skin has healed.  Your infection returns in the same area or another area.  You notice a swollen bump in the infected area.  You develop new symptoms. SEEK IMMEDIATE MEDICAL CARE IF:   You have a fever.  You feel very sleepy.  You develop vomiting or diarrhea.  You have a general ill feeling (malaise) with muscle aches and pains. MAKE SURE YOU:   Understand these instructions.  Will watch your condition.  Will get help right away if you are not doing well or get worse. Document Released: 11/23/2004 Document Revised: 08/15/2011 Document Reviewed: 05/01/2011 [employer] Patient Information 2014 [employer].  

## 2013-06-02 NOTE — ED Provider Notes (Signed)
CSN: [id number]     Arrival date & time 06/02/13  0912 History   First MD Initiated Contact with Patient 06/02/13 [phone]     Chief Complaint  Patient presents with  . Leg Swelling      HPI Patient left the emergency department by her daughter after she noted a small area of drainage on the posterior left lower leg.  Patient is not very mobile at home.  She struggled with lower extremity edema and venous insufficiency for some time.  She is supposed to wear compression stockings but has not.  No reported fever.  Patient reports no significant pain.  She reports more weeping fluid and a small amount of yellow drainage.  No new numbness or weakness in her lower extremities.   Past Medical History  Diagnosis Date  . Diabetes mellitus   . Hypertension   . Thyroid disease   . Hypothyroidism   . Blood transfusion 1952    Bld [facility] w/C/S  - [facility] [place]  . Seasonal allergies     no meds  . Arthritis     shoulder, back   Past Surgical History  Procedure Laterality Date  . Eye surgery      bilateral - cataract surgery  . Svd      x 1   No family history on file. History  Substance Use Topics  . Smoking status: Never Smoker   . Smokeless tobacco: Never Used  . Alcohol Use: No   OB History   Grav Para Term Preterm Abortions TAB SAB Ect Mult Living                 Review of Systems  All other systems reviewed and are negative.      Allergies  Penicillins  Home Medications   Current Outpatient Rx  Name  Route  Sig  Dispense  Refill  . furosemide (LASIX) 20 MG tablet   Oral   Take 20 mg by mouth daily.         Marland Kitchen. HUMULIN N 100 UNIT/ML injection   Subcutaneous   Inject 8 Units into the skin 2 (two) times daily.          . irbesartan (AVAPRO) 300 MG tablet   Oral   Take 300 mg by mouth daily.         Marland Kitchen. levothyroxine (SYNTHROID, LEVOTHROID) 50 MCG tablet   Oral   Take 50 mcg by mouth daily.         . metoprolol succinate (TOPROL-XL)  50 MG 24 hr tablet   Oral   Take 50 mg by mouth daily.         Marland Kitchen. sulfamethoxazole-trimethoprim (BACTRIM DS,SEPTRA DS) 800-160 MG per tablet   Oral   Take 1 tablet by mouth 2 (two) times daily.   14 tablet   0    BP 178/46  Pulse 64  Temp(Src) 97.5 F (36.4 C) (Oral)  Resp 63  Ht 5\' 3"  (1.6 m)  Wt 137 lb (62.143 kg)  BMI 24.27 kg/m2  SpO2 100% Physical Exam  Nursing note and vitals reviewed. Constitutional: She is oriented to person, place, and time. She appears well-developed and well-nourished.  HENT:  Head: Normocephalic.  Eyes: EOM are normal.  Neck: Normal range of motion.  Pulmonary/Chest: Effort normal.  Abdominal: She exhibits no distension.  Musculoskeletal: Normal range of motion.  Plus pitting edema bilaterally.  Normal PT and DP pulses palpable.  Small amount of yellow crusting  and drainage of her posterior left calf region.  Small amount of erythema without tenderness.  Neurological: She is alert and oriented to person, place, and time.  Psychiatric: She has a normal mood and affect.    ED Course  Procedures (including critical care time) Labs Review Labs Reviewed - No data to display Imaging Review No results found.   EKG Interpretation None      MDM   Final diagnoses:  Cellulitis of leg, left  Venous insufficiency of both lower extremities    Patient is overall well-appearing.  The majority of this is venous insufficiency.  Leg elevation and compression stockings recommended.  We'll initiate on antibiotics.  Close PCP followup.  She understands return to the ER for new or worsening symptoms.    Kari Co, MD 06/02/13 [phone]

## 2013-06-02 NOTE — ED Notes (Signed)
Pt states she has a place on the back of her left leg that is leaking and was up here last July she thinks and had to have antibiotics for it. Leg is also swollen more so than the right leg

## 2013-06-09 ENCOUNTER — Emergency Department (HOSPITAL_COMMUNITY)
Admission: EM | Admit: 2013-06-09 | Discharge: 2013-06-09 | Disposition: A | Discharge status: Home / Self Care | Payer: Medicare PPO | Attending: Emergency Medicine | Admitting: Emergency Medicine

## 2013-06-09 ENCOUNTER — Emergency Department (HOSPITAL_COMMUNITY): Payer: Medicare PPO

## 2013-06-09 ENCOUNTER — Encounter (HOSPITAL_COMMUNITY): Payer: Self-pay | Admitting: Emergency Medicine

## 2013-06-09 DIAGNOSIS — Z880 Allergy status to penicillin: Secondary | ICD-10-CM | POA: Insufficient documentation

## 2013-06-09 DIAGNOSIS — M129 Arthropathy, unspecified: Secondary | ICD-10-CM | POA: Insufficient documentation

## 2013-06-09 DIAGNOSIS — E039 Hypothyroidism, unspecified: Secondary | ICD-10-CM | POA: Insufficient documentation

## 2013-06-09 DIAGNOSIS — Z79899 Other long term (current) drug therapy: Secondary | ICD-10-CM | POA: Insufficient documentation

## 2013-06-09 DIAGNOSIS — I10 Essential (primary) hypertension: Secondary | ICD-10-CM | POA: Insufficient documentation

## 2013-06-09 DIAGNOSIS — M542 Cervicalgia: Secondary | ICD-10-CM | POA: Insufficient documentation

## 2013-06-09 DIAGNOSIS — E119 Type 2 diabetes mellitus without complications: Secondary | ICD-10-CM | POA: Insufficient documentation

## 2013-06-09 DIAGNOSIS — Z794 Long term (current) use of insulin: Secondary | ICD-10-CM | POA: Insufficient documentation

## 2013-06-09 LAB — COMPREHENSIVE METABOLIC PANEL
ALT: 9 U/L (ref 0–35)
AST: 16 U/L (ref 0–37)
Albumin: 3.5 g/dL (ref 3.5–5.2)
Alkaline Phosphatase: 91 U/L (ref 39–117)
BILIRUBIN TOTAL: 0.4 mg/dL (ref 0.3–1.2)
BUN: 21 mg/dL (ref 6–23)
CALCIUM: 8.8 mg/dL (ref 8.4–10.5)
CHLORIDE: 96 meq/L (ref 96–112)
CO2: 27 meq/L (ref 19–32)
Creatinine, Ser: 1.18 mg/dL — ABNORMAL HIGH (ref 0.50–1.10)
GFR calc Af Amer: 46 mL/min — ABNORMAL LOW (ref >90)
GFR calc non Af Amer: 39 mL/min — ABNORMAL LOW (ref >90)
Glucose, Bld: 123 mg/dL — ABNORMAL HIGH (ref 70–99)
Potassium: 4.4 mEq/L (ref 3.7–5.3)
Sodium: 134 mEq/L — ABNORMAL LOW (ref 137–147)
Total Protein: 6.8 g/dL (ref 6.0–8.3)

## 2013-06-09 LAB — CBC WITH DIFFERENTIAL/PLATELET
BASOS PCT: 1 % (ref 0–1)
Basophils Absolute: 0 10*3/uL (ref 0.0–0.1)
Eosinophils Absolute: 0 10*3/uL (ref 0.0–0.7)
Eosinophils Relative: 0 % (ref 0–5)
HEMATOCRIT: 38 % (ref 36.0–46.0)
Hemoglobin: 13 g/dL (ref 12.0–15.0)
LYMPHS PCT: 22 % (ref 12–46)
Lymphs Abs: 0.6 10*3/uL — ABNORMAL LOW (ref 0.7–4.0)
MCH: 31.4 pg (ref 26.0–34.0)
MCHC: 34.2 g/dL (ref 30.0–36.0)
MCV: 91.8 fL (ref 78.0–100.0)
MONOS PCT: 13 % — AB (ref 3–12)
Monocytes Absolute: 0.4 10*3/uL (ref 0.1–1.0)
NEUTROS ABS: 1.8 10*3/uL (ref 1.7–7.7)
Neutrophils Relative %: 64 % (ref 43–77)
Platelets: 132 10*3/uL — ABNORMAL LOW (ref 150–400)
RBC: 4.14 MIL/uL (ref 3.87–5.11)
RDW: 14 % (ref 11.5–15.5)
WBC: 2.8 10*3/uL — ABNORMAL LOW (ref 4.0–10.5)

## 2013-06-09 MED ORDER — HYDROCODONE-ACETAMINOPHEN 5-325 MG PO TABS: 1.0000 | ORAL_TABLET | ORAL | Status: DC | PRN

## 2013-06-09 MED ORDER — FENTANYL CITRATE 0.05 MG/ML IJ SOLN
50.0000 ug | Freq: Once | INTRAMUSCULAR | Status: AC
  Administered 2013-06-09: 50 ug via INTRAVENOUS
  Filled 2013-06-09: qty 2

## 2013-06-09 MED ORDER — OXYCODONE-ACETAMINOPHEN 5-325 MG PO TABS
1.0000 | ORAL_TABLET | Freq: Once | ORAL | Status: AC
  Administered 2013-06-09: 1 via ORAL
  Filled 2013-06-09: qty 1

## 2013-06-09 NOTE — ED Notes (Signed)
Patient transported to X-ray 

## 2013-06-09 NOTE — ED Notes (Signed)
Per EMS report: pt from home: [facility] and was discharged home.  Pt c/o progressively worsening pain since the fall.  Pain is in her legs, back, right side, and neck.  Pt a/o x 4.  Skin warm and dry.  Pt has a skin tear on her left forearm that has been address.

## 2013-06-09 NOTE — ED Provider Notes (Signed)
CSN: [id number]     Arrival date & time 06/09/13  0150 History   First MD Initiated Contact with Patient 06/09/13 0158     Chief Complaint  Patient presents with  . Pain      HPI Patient is brought to the emergency department because of ongoing pain in her back, neck, chest, abdomen since the fall.  Family states that the pain worsened today.  She denies active chest pain at this time.  She reports the majority pain is located in her left lateral neck.  She denies difficulty breathing or swallowing.  No recent nausea or vomiting.  Family reports that she's been eating normal.  Family states that she continues to have swelling of her lower extremities but it is significantly improved since wearing compression stockings which was discussed during her last ER visit 7 days ago.  No reported increasing confusion.  Patient's mobility is continuing to decrease at home.  Family is seeking assistance in placement into a long-term care facility.  The patient's daughter reports that she had a stroke in November and is having increasing difficulty caring for her aging mother   Past Medical History  Diagnosis Date  . Diabetes mellitus   . Hypertension   . Thyroid disease   . Hypothyroidism   . Blood transfusion 1952    [facility] [place]  . Seasonal allergies     no meds  . Arthritis     shoulder, back   Past Surgical History  Procedure Laterality Date  . Eye surgery      bilateral - cataract surgery  . Svd      x 1   No family history on file. History  Substance Use Topics  . Smoking status: Never Smoker   . Smokeless tobacco: Never Used  . Alcohol Use: No   OB History   Grav Para Term Preterm Abortions TAB SAB Ect Mult Living                 Review of Systems  All other systems reviewed and are negative.     Allergies  Penicillins  Home Medications   Current Outpatient Rx  Name  Route  Sig  Dispense  Refill  . furosemide (LASIX) 20 MG  tablet   Oral   Take 20 mg by mouth daily.         Marland Kitchen. HUMULIN N 100 UNIT/ML injection   Subcutaneous   Inject 8 Units into the skin 2 (two) times daily.          . irbesartan (AVAPRO) 300 MG tablet   Oral   Take 300 mg by mouth daily.         Marland Kitchen. levothyroxine (SYNTHROID, LEVOTHROID) 50 MCG tablet   Oral   Take 50 mcg by mouth daily.         . metoprolol succinate (TOPROL-XL) 50 MG 24 hr tablet   Oral   Take 50 mg by mouth daily.         Marland Kitchen. sulfamethoxazole-trimethoprim (BACTRIM DS,SEPTRA DS) 800-160 MG per tablet   Oral   Take 1 tablet by mouth 2 (two) times daily.   14 tablet   0    BP 155/42  Pulse 71  Temp(Src) 98.4 F (36.9 C) (Oral)  Resp 16  SpO2 100% Physical Exam  Nursing note and vitals reviewed. Constitutional: She appears well-developed and well-nourished. No distress.  HENT:  Head: Normocephalic and atraumatic.  Eyes:  EOM are normal.  Neck: Normal range of motion. Neck supple.  Mild cervical and paracervical tenderness without step-off  Cardiovascular: Normal rate, regular rhythm and normal heart sounds.   Pulmonary/Chest: Effort normal and breath sounds normal.  Abdominal: Soft. She exhibits no distension. There is no tenderness.  Musculoskeletal: Normal range of motion.  Neurological: She is alert.  Skin: Skin is warm and dry.  Psychiatric: She has a normal mood and affect. Judgment normal.    ED Course  Procedures (including critical care time) Labs Review Labs Reviewed  CBC WITH DIFFERENTIAL - Abnormal; Notable for the following:    WBC 2.8 (*)    Platelets 132 (*)    Lymphs Abs 0.6 (*)    Monocytes Relative 13 (*)    All other components within normal limits  COMPREHENSIVE METABOLIC PANEL - Abnormal; Notable for the following:    Sodium 134 (*)    Glucose, Bld 123 (*)    Creatinine, Ser 1.18 (*)    GFR calc non Af Amer 39 (*)    GFR calc Af Amer 46 (*)    All other components within normal limits   Imaging Review Dg Cervical  Spine Complete  06/09/2013   CLINICAL DATA:  Multiple falls  EXAM: CERVICAL SPINE  4+ VIEWS  COMPARISON:  CT C SPINE W/O CM dated 02/24/2013  FINDINGS: The cervical spine is visualized to the level of C6-7.  The vertebral body heights are maintained. The alignment is normal. The prevertebral soft tissues are normal. There is no acute fracture or static listhesis. There is degenerative disc disease at C5-6 and C6-7.  IMPRESSION: No acute osseous injury of the cervical spine.   Electronically Signed   By: Elige KoHetal  Patel   On: 06/09/2013 03:58   Dg Abd Acute W/chest  06/09/2013   CLINICAL DATA:  Abdominal pain  EXAM: ACUTE ABDOMEN SERIES (ABDOMEN 2 VIEW & CHEST 1 VIEW)  COMPARISON:  None.  FINDINGS: There are mild bilateral chronic bronchitic changes. There is no focal consolidation, pleural effusion or pneumothorax. The heart and mediastinal contours are unremarkable. The osseous structures are unremarkable.  There is no bowel dilatation to suggest obstruction. There are no air-fluid levels. There is no evidence of pneumoperitoneum, portal venous gas, or pneumatosis.  There is osteoarthritis of the right glenohumeral joint. There is lower lumbar spine spondylosis. There is osteoarthritis of bilateral hips.  IMPRESSION: Negative abdominal radiographs.  No acute cardiopulmonary disease.   Electronically Signed   By: Elige KoHetal  Patel   On: 06/09/2013 04:00  I personally reviewed the imaging tests through PACS system I reviewed available ER/hospitalization records through the EMR     EKG Interpretation None      MDM   Final diagnoses:  Neck pain    Patient with ongoing pain since fall.  Chest x-ray and C-spine without pathology.  Family having increasing difficulty taking care of this patient at home is requesting placement in a term care facility.  I've informed her that the patient has no indication for admission the hospital this time he cannot be placed up the emergency department.  Consult has been  placed to case management who will give the information in the morning and contact the patient's daughter and provide home health resources additional information regarding long-term care placement options.  Also instructed the patient's daughter to speak with her primary care physician regarding assistance with placement  5:15 AM Patient reports she feels much better after fentanyl.  Home with instructions for her  ongoing ibuprofen and Tylenol.  Lyanne Co, MD 06/09/13 [phone]

## 2013-06-09 NOTE — ED Notes (Signed)
Pt has bilateral non pitting edema in which pt is suppose to be wearing compression stockings.

## 2013-06-11 ENCOUNTER — Encounter (HOSPITAL_COMMUNITY): Payer: Self-pay | Admitting: Emergency Medicine

## 2013-06-11 ENCOUNTER — Observation Stay (HOSPITAL_COMMUNITY)
Admission: EM | Admit: 2013-06-11 | Discharge: 2013-06-13 | Disposition: A | Discharge status: Skilled Nursing Facility | Payer: Medicare PPO | Attending: Internal Medicine | Admitting: Internal Medicine

## 2013-06-11 ENCOUNTER — Emergency Department (HOSPITAL_COMMUNITY): Payer: Medicare PPO

## 2013-06-11 DIAGNOSIS — E119 Type 2 diabetes mellitus without complications: Secondary | ICD-10-CM | POA: Insufficient documentation

## 2013-06-11 DIAGNOSIS — W19XXXA Unspecified fall, initial encounter: Secondary | ICD-10-CM

## 2013-06-11 DIAGNOSIS — Z9181 History of falling: Secondary | ICD-10-CM | POA: Insufficient documentation

## 2013-06-11 DIAGNOSIS — E860 Dehydration: Principal | ICD-10-CM | POA: Insufficient documentation

## 2013-06-11 DIAGNOSIS — R296 Repeated falls: Secondary | ICD-10-CM

## 2013-06-11 DIAGNOSIS — L03116 Cellulitis of left lower limb: Secondary | ICD-10-CM

## 2013-06-11 DIAGNOSIS — E876 Hypokalemia: Secondary | ICD-10-CM

## 2013-06-11 DIAGNOSIS — L02419 Cutaneous abscess of limb, unspecified: Secondary | ICD-10-CM

## 2013-06-11 DIAGNOSIS — L03119 Cellulitis of unspecified part of limb: Secondary | ICD-10-CM

## 2013-06-11 DIAGNOSIS — E039 Hypothyroidism, unspecified: Secondary | ICD-10-CM

## 2013-06-11 DIAGNOSIS — I10 Essential (primary) hypertension: Secondary | ICD-10-CM | POA: Insufficient documentation

## 2013-06-11 LAB — URINALYSIS, ROUTINE W REFLEX MICROSCOPIC
Bilirubin Urine: NEGATIVE
GLUCOSE, UA: 100 mg/dL — AB
Ketones, ur: NEGATIVE mg/dL
Leukocytes, UA: NEGATIVE
Nitrite: NEGATIVE
PROTEIN: NEGATIVE mg/dL
SPECIFIC GRAVITY, URINE: 1.021 (ref 1.005–1.030)
Urobilinogen, UA: 0.2 mg/dL (ref 0.0–1.0)
pH: 5 (ref 5.0–8.0)

## 2013-06-11 LAB — COMPREHENSIVE METABOLIC PANEL
ALT: 26 U/L (ref 0–35)
AST: 60 U/L — AB (ref 0–37)
Albumin: 3.4 g/dL — ABNORMAL LOW (ref 3.5–5.2)
Alkaline Phosphatase: 84 U/L (ref 39–117)
BILIRUBIN TOTAL: 0.5 mg/dL (ref 0.3–1.2)
BUN: 34 mg/dL — ABNORMAL HIGH (ref 6–23)
CO2: 25 mEq/L (ref 19–32)
CREATININE: 1.05 mg/dL (ref 0.50–1.10)
Calcium: 8.7 mg/dL (ref 8.4–10.5)
Chloride: 96 mEq/L (ref 96–112)
GFR calc Af Amer: 53 mL/min — ABNORMAL LOW (ref >90)
GFR calc non Af Amer: 45 mL/min — ABNORMAL LOW (ref >90)
Glucose, Bld: 252 mg/dL — ABNORMAL HIGH (ref 70–99)
POTASSIUM: 4.7 meq/L (ref 3.7–5.3)
Sodium: 136 mEq/L — ABNORMAL LOW (ref 137–147)
Total Protein: 6.8 g/dL (ref 6.0–8.3)

## 2013-06-11 LAB — CBC WITH DIFFERENTIAL/PLATELET
BASOS PCT: 0 % (ref 0–1)
Basophils Absolute: 0 10*3/uL (ref 0.0–0.1)
Eosinophils Absolute: 0 10*3/uL (ref 0.0–0.7)
Eosinophils Relative: 0 % (ref 0–5)
HCT: 37.4 % (ref 36.0–46.0)
Hemoglobin: 13.1 g/dL (ref 12.0–15.0)
Lymphocytes Relative: 14 % (ref 12–46)
Lymphs Abs: 0.6 10*3/uL — ABNORMAL LOW (ref 0.7–4.0)
MCH: 31.5 pg (ref 26.0–34.0)
MCHC: 35 g/dL (ref 30.0–36.0)
MCV: 89.9 fL (ref 78.0–100.0)
MONO ABS: 0.4 10*3/uL (ref 0.1–1.0)
Monocytes Relative: 8 % (ref 3–12)
NEUTROS PCT: 78 % — AB (ref 43–77)
Neutro Abs: 3.5 10*3/uL (ref 1.7–7.7)
PLATELETS: 128 10*3/uL — AB (ref 150–400)
RBC: 4.16 MIL/uL (ref 3.87–5.11)
RDW: 13.8 % (ref 11.5–15.5)
WBC: 4.6 10*3/uL (ref 4.0–10.5)

## 2013-06-11 LAB — CBC
HCT: 35.6 % — ABNORMAL LOW (ref 36.0–46.0)
Hemoglobin: 12.3 g/dL (ref 12.0–15.0)
MCH: 31.1 pg (ref 26.0–34.0)
MCHC: 34.6 g/dL (ref 30.0–36.0)
MCV: 89.9 fL (ref 78.0–100.0)
Platelets: 128 10*3/uL — ABNORMAL LOW (ref 150–400)
RBC: 3.96 MIL/uL (ref 3.87–5.11)
RDW: 13.9 % (ref 11.5–15.5)
WBC: 5.4 10*3/uL (ref 4.0–10.5)

## 2013-06-11 LAB — PRO B NATRIURETIC PEPTIDE: Pro B Natriuretic peptide (BNP): 842 pg/mL — ABNORMAL HIGH (ref 0–450)

## 2013-06-11 LAB — TSH: TSH: 4.43 u[IU]/mL (ref 0.350–4.500)

## 2013-06-11 LAB — URINE MICROSCOPIC-ADD ON

## 2013-06-11 LAB — VITAMIN B12: Vitamin B-12: 293 pg/mL (ref 211–911)

## 2013-06-11 LAB — GLUCOSE, CAPILLARY
GLUCOSE-CAPILLARY: 273 mg/dL — AB (ref 70–99)
Glucose-Capillary: 166 mg/dL — ABNORMAL HIGH (ref 70–99)
Glucose-Capillary: 83 mg/dL (ref 70–99)

## 2013-06-11 LAB — TROPONIN I

## 2013-06-11 MED ORDER — ENOXAPARIN SODIUM 40 MG/0.4ML ~~LOC~~ SOLN
40.0000 mg | SUBCUTANEOUS | Status: DC
  Administered 2013-06-11 – 2013-06-12 (×2): 40 mg via SUBCUTANEOUS
  Filled 2013-06-11 (×3): qty 0.4

## 2013-06-11 MED ORDER — TRAMADOL HCL 50 MG PO TABS: 25.0000 mg | ORAL_TABLET | Freq: Four times a day (QID) | ORAL | Status: DC | PRN

## 2013-06-11 MED ORDER — METOPROLOL SUCCINATE ER 50 MG PO TB24
50.0000 mg | ORAL_TABLET | Freq: Every day | ORAL | Status: DC
  Filled 2013-06-11: qty 1

## 2013-06-11 MED ORDER — LEVOTHYROXINE SODIUM 50 MCG PO TABS
50.0000 ug | ORAL_TABLET | Freq: Every day | ORAL | Status: DC
  Administered 2013-06-12 – 2013-06-13 (×2): 50 ug via ORAL
  Filled 2013-06-11 (×2): qty 1

## 2013-06-11 MED ORDER — ONDANSETRON HCL 4 MG/2ML IJ SOLN: 4.0000 mg | Freq: Four times a day (QID) | INTRAMUSCULAR | Status: DC | PRN

## 2013-06-11 MED ORDER — SODIUM CHLORIDE 0.9 % IJ SOLN
3.0000 mL | INTRAMUSCULAR | Status: DC | PRN
  Administered 2013-06-12: 3 mL via INTRAVENOUS

## 2013-06-11 MED ORDER — IRBESARTAN 300 MG PO TABS
300.0000 mg | ORAL_TABLET | Freq: Every day | ORAL | Status: DC
  Administered 2013-06-13: 300 mg via ORAL
  Filled 2013-06-11 (×2): qty 1

## 2013-06-11 MED ORDER — INSULIN NPH (HUMAN) (ISOPHANE) 100 UNIT/ML ~~LOC~~ SUSP
8.0000 [IU] | Freq: Every day | SUBCUTANEOUS | Status: DC
  Administered 2013-06-12 – 2013-06-13 (×2): 8 [IU] via SUBCUTANEOUS
  Filled 2013-06-11: qty 10

## 2013-06-11 MED ORDER — ACETAMINOPHEN 325 MG PO TABS
650.0000 mg | ORAL_TABLET | Freq: Four times a day (QID) | ORAL | Status: DC | PRN
  Administered 2013-06-11: 650 mg via ORAL
  Filled 2013-06-11: qty 2

## 2013-06-11 MED ORDER — INSULIN ASPART 100 UNIT/ML ~~LOC~~ SOLN
0.0000 [IU] | Freq: Three times a day (TID) | SUBCUTANEOUS | Status: DC
  Administered 2013-06-11: 8 [IU] via SUBCUTANEOUS
  Administered 2013-06-12: 3 [IU] via SUBCUTANEOUS
  Administered 2013-06-13: 8 [IU] via SUBCUTANEOUS
  Administered 2013-06-13: 2 [IU] via SUBCUTANEOUS

## 2013-06-11 MED ORDER — INSULIN NPH (HUMAN) (ISOPHANE) 100 UNIT/ML ~~LOC~~ SUSP
8.0000 [IU] | Freq: Two times a day (BID) | SUBCUTANEOUS | Status: DC
  Filled 2013-06-11: qty 10

## 2013-06-11 MED ORDER — ACETAMINOPHEN 650 MG RE SUPP: 650.0000 mg | Freq: Four times a day (QID) | RECTAL | Status: DC | PRN

## 2013-06-11 MED ORDER — INSULIN NPH (HUMAN) (ISOPHANE) 100 UNIT/ML ~~LOC~~ SUSP
8.0000 [IU] | Freq: Every day | SUBCUTANEOUS | Status: DC
  Administered 2013-06-11 – 2013-06-12 (×2): 8 [IU] via SUBCUTANEOUS
  Filled 2013-06-11: qty 10

## 2013-06-11 MED ORDER — SODIUM CHLORIDE 0.9 % IJ SOLN
3.0000 mL | Freq: Two times a day (BID) | INTRAMUSCULAR | Status: DC
  Administered 2013-06-12 (×2): 3 mL via INTRAVENOUS

## 2013-06-11 MED ORDER — SODIUM CHLORIDE 0.9 % IV SOLN: 250.0000 mL | INTRAVENOUS | Status: DC | PRN

## 2013-06-11 MED ORDER — FUROSEMIDE 20 MG PO TABS: 20.0000 mg | ORAL_TABLET | Freq: Every day | ORAL | Status: DC

## 2013-06-11 MED ORDER — SODIUM CHLORIDE 0.9 % IV SOLN
Freq: Once | INTRAVENOUS | Status: AC
  Administered 2013-06-11: 11:00:00 via INTRAVENOUS

## 2013-06-11 MED ORDER — ONDANSETRON HCL 4 MG PO TABS: 4.0000 mg | ORAL_TABLET | Freq: Four times a day (QID) | ORAL | Status: DC | PRN

## 2013-06-11 NOTE — H&P (Signed)
[facility] History and Physical  Marnita Poirier ZOX:[id number] DOB: 1920-04-12 DOA: 06/11/2013  Referring physician: EDP PCP: Allean Found, MD   Chief Complaint: Weakness and frequent falls  HPI: Kari Ford is a 80 y.o. female history of hypertension, diabetes, hypothyroidism, recently treated for cellulitis who lives at home with her daughter and presents with above complaints. Her daughter is at the bedside assisting with the history and states that for a good while she has had a problem with falls, but this has worsened in the past week. Her daughter states that she has been falling daily in the past week -"every time she gets up she falls". It is noted that she was seen in the ED on 4/13 with complaints of pain following one of her falls and imaging studies were negative for fractures, CT scan of head showed a stable small calcified meningioma without surrounding edema, and no hemorrhage or infarct noted. Patient states she had been started on some pain medications about a week ago and she states that she is felt dizzy in the past couple of days which she takes for pain meds. Her daughter also reports some intermittent confusion in the past week, but she is lucid the ED. She reports that the leg swelling that she has had this improved since she was a treated with antibiotics for cellulitis. She denies syncope, and no shortness of breath. She was seen in the ED and chemistries were within normal limits except for an elevated BUN of 34, urinalysis was unremarkable. Patient was unable to ambulate in ED and her daughter is unable to take care of her at home at this time. She is admitted for further evaluation and management.    Review of Systems The patient denies anorexia, fever, weight loss,, vision loss, decreased hearing, hoarseness, chest pain, syncope, dyspnea on exertion,, hemoptysis, abdominal pain, melena, hematochezia, severe indigestion/heartburn, hematuria, incontinence,  genital sores, muscle weakness, suspicious skin lesions, transient blindness, depression, unusual weight change, abnormal bleeding, enlarged lymph nodes, angioedema, and breast masses.   Past Medical History  Diagnosis Date  . Diabetes mellitus   . Hypertension   . Thyroid disease   . Hypothyroidism   . Blood transfusion 1952    Bld Transfusion w/C/S  - [facility] [place]  . Seasonal allergies     no meds  . Arthritis     shoulder, back   Past Surgical History  Procedure Laterality Date  . Eye surgery      bilateral - cataract surgery  . Svd      x 1   Social History:  reports that she has never smoked. She has never used smokeless tobacco. She reports that she does not drink alcohol or use illicit drugs.  Allergies  Allergen Reactions  . Penicillins Hives    History reviewed. No pertinent family history.   Prior to Admission medications   Medication Sig Start Date End Date Taking? Authorizing Provider  furosemide (LASIX) 20 MG tablet Take 20 mg by mouth daily. 05/30/13  Yes Historical Provider, MD  HUMULIN N 100 UNIT/ML injection Inject 8 Units into the skin 2 (two) times daily.  05/30/13  Yes Historical Provider, MD  HYDROcodone-acetaminophen (NORCO/VICODIN) 5-325 MG per tablet Take 1 tablet by mouth every 4 (four) hours as needed for moderate pain. 06/09/13  Yes Lyanne Co, MD  irbesartan (AVAPRO) 300 MG tablet Take 300 mg by mouth daily.   Yes Historical Provider, MD  levothyroxine (SYNTHROID, LEVOTHROID) 50 MCG  tablet Take 50 mcg by mouth daily.   Yes Historical Provider, MD  metoprolol succinate (TOPROL-XL) 50 MG 24 hr tablet Take 50 mg by mouth daily. 05/30/13  Yes Historical Provider, MD  sulfamethoxazole-trimethoprim (BACTRIM DS) 800-160 MG per tablet Take 1 tablet by mouth 2 (two) times daily. Starting 4/6 for 7 days 06/02/13   Historical Provider, MD   Physical Exam: Filed Vitals:   06/11/13 1345  BP: 153/63  Pulse: 72  Temp: 97.6 F (36.4 C)  Resp:  16    BP 153/63  Pulse 72  Temp(Src) 97.6 F (36.4 C) (Oral)  Resp 16  Ht 5\' 4"  (1.626 m)  Wt 63.5 kg (139 lb 15.9 oz)  BMI 24.02 kg/m2  SpO2 99% Constitutional: Vital signs reviewed.  Patient is a well-developed and well-nourished in no acute distress and cooperative with exam. Alert and oriented x3.  Head: Normocephalic and atraumatic Ear: TM normal bilaterally Mouth: no erythema or exudates, MMM Eyes: PERRL, EOMI, conjunctivae normal, No scleral icterus.  Neck: Supple, Trachea midline normal ROM, No JVD, mass, thyromegaly, or carotid bruit present.  Cardiovascular: RRR, S1 normal, S2 normal, no MRG, pulses symmetric and intact bilaterally Pulmonary/Chest: normal respiratory effort, decreased breath sounds at bases, no wheezes, rales, or rhonchi Abdominal: Soft. Non-tender, non-distended, bowel sounds are normal, no masses, organomegaly, or guarding present.  GU: no CVA tenderness  extremities: Mild lower leg erythema bilaterally, with a superficial ulcer anteriorly on the L. lower shin (from last fall per Daughter).  Neurological: A&O x3, Strength is normal and symmetric bilaterally, cranial nerve II-XII are grossly intact, no focal motor deficit, sensory intact to light touch bilaterally.  Skin: Warm, dry and intact. No rash.  Psychiatric: Normal mood and affect. speech and behavior is normal. Judgment and thought content normal.               Labs on Admission:  Basic Metabolic Panel:  Recent Labs Lab 06/09/13 0304 06/11/13 0835  NA 134* 136*  K 4.4 4.7  CL 96 96  CO2 27 25  GLUCOSE 123* 252*  BUN 21 34*  CREATININE 1.18* 1.05  CALCIUM 8.8 8.7   Liver Function Tests:  Recent Labs Lab 06/09/13 0304 06/11/13 0835  AST 16 60*  ALT 9 26  ALKPHOS 91 84  BILITOT 0.4 0.5  PROT 6.8 6.8  ALBUMIN 3.5 3.4*   No results found for this basename: LIPASE, AMYLASE,  in the last 168 hours No results found for this basename: AMMONIA,  in the last 168  hours CBC:  Recent Labs Lab 06/09/13 0304 06/11/13 0835  WBC 2.8* 4.6  NEUTROABS 1.8 3.5  HGB 13.0 13.1  HCT 38.0 37.4  MCV 91.8 89.9  PLT 132* 128*   Cardiac Enzymes:  Recent Labs Lab 06/11/13 0835  TROPONINI <0.30    BNP (last 3 results)  Recent Labs  06/11/13 0835  PROBNP 842.0*   CBG:  Recent Labs Lab 06/11/13 1350  GLUCAP 166*    Radiological Exams on Admission: Ct Head Wo Contrast  06/11/2013   CLINICAL DATA:  Altered mental status  EXAM: CT HEAD WITHOUT CONTRAST  TECHNIQUE: Contiguous axial images were obtained from the base of the skull through the vertex without intravenous contrast. Study was obtained within 24 hr of patient's arrival at the emergency department.  COMPARISON:  February 24, 2013  FINDINGS: There is moderate diffuse atrophy. There is a calcified meningioma in the posterior superior right temporal region measuring 8 x 8 mm without surrounding  edema. There is no other evidence of mass. There is no hemorrhage, extra-axial fluid collection, or midline shift. There is moderate small vessel disease throughout the centra semiovale bilaterally. There is no new gray-white compartment lesion. There is no demonstrable acute infarct.  The bony calvarium appears intact. A small enostosis arising from the left temporal bone measuring 6 x 6 mm is stable. Mastoid air cells on the left are clear. There is opacification of several right-sided mastoid air cells inferiorly, stable.  IMPRESSION: Atrophy with small vessel disease, stable. Small calcified right temporal region meningioma without surrounding edema is stable. This finding is not felt to have clinical significance. 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 There is no intracranial mass, hemorrhage, or acute appearing infarct.   Electronically Signed   By: Bretta BangWilliam  Woodruff M.D.   On: 06/11/2013 09:35   Dg Chest Portable 1 View  06/11/2013   CLINICAL DATA:  Failure to thrive, altered mental status,  hypertension, diabetes  EXAM: PORTABLE CHEST - 1 VIEW  COMPARISON:  Portable exam 0924 hr compared to 06/09/2013  FINDINGS: Upper normal heart size.  Calcified tortuous aorta.  Mediastinal contours pulmonary vascularity normal.  Bronchitic changes with minimal bibasilar atelectasis.  No infiltrate, pleural effusion or pneumothorax.  Bones demineralized.  Chronic right rotator cuff tear.  Scattered degenerative disc disease changes of thoracolumbar spine.  IMPRESSION: Bronchitic changes with mild bibasilar atelectasis.   Electronically Signed   By: Ulyses SouthwardMark  Boles M.D.   On: 06/11/2013 09:50    EKG: Independently reviewed. No sinus rhythm at rate of 66 with no acute ischemic changes  Assessment/Plan Active Problems:   Falls frequently -As discussed above, will obtain TSH, B12 and folic level -Consult PT OT and follow Peripheral edema -BNP on 4/13 was elevated, she has no prior history of CHF -Will obtain echo, hold off on Lasix and given her elevated BUN -Follow and treat as appropriate pending studies Volume depletion -Hold Lasix for now follow, recheck the meds and further treat accordingly -Will avoid IV fluids for now given her peripheral edema as above   Type II or unspecified type diabetes mellitus without mention of complication, not stated as uncontrolled -Continue NPH monitor Accu-Cheks and cover with sliding scale   Essential hypertension, benign -Continue outpatient medications   Unspecified hypothyroidism -Continue Synthroid, check TSH and follow Recent cellulitis -She status post completion of full course of antibiotics.       Code Status: Full Family Communication: Daughter at bedside Disposition Plan: admit to med-surge  Time spent: >8230mins  Kela Millindeline C Jackquelyn Sundberg [facility] Pager [phone]

## 2013-06-11 NOTE — ED Notes (Signed)
PT at bedside.

## 2013-06-11 NOTE — Progress Notes (Signed)
Patient admitted to room 234 [id number].  Her daughter is at the bedside.  Dr Suanne MarkerViyuoh text paged and notified of patient's arrival.

## 2013-06-11 NOTE — ED Notes (Signed)
Pt has stage II pressure ulcer to right and left buttock. Nonblanching red area

## 2013-06-11 NOTE — ED Notes (Signed)
Notified CSW that pt's dtr would like assistance with placement for SNF

## 2013-06-11 NOTE — ED Notes (Signed)
Pt's dtr called EMS for pt having AMS starting last night. Pt's dtr told EMS that pt was "talking out of her head." EMS states pt has been A/Ox4 since their arrival. Pt is nonambulatory and has chronic pain. Pt was started on new pain medication last week. Pt's dtr is concerned that she is unable to take care of her mother- wants to get her in a nursing home. EMS arrived and pt was in her feces. Pt stays at home alone for hours while dtr goes to work. Pt is not able to eat much and clean herself. BP 130/70, HR 80, CBG 265

## 2013-06-11 NOTE — Clinical Social Work Note (Signed)
6N CSW received verbal handoff from ED CSW. CSW has consulted with CSW ChiropodistAssistant Director regarding discharge disposition. CSW has left handoff for covering CSW regarding discharge planning needs.  Darlyn ChamberEmily [place], LCSWA Clinical Social Worker [phone]

## 2013-06-11 NOTE — Clinical Social Work Psychosocial (Signed)
Clinical Social Work Department BRIEF PSYCHOSOCIAL ASSESSMENT 06/11/2013  Patient:  Kari Ford,Kari Ford     Account Number:  [id number]     Admit date:  06/11/2013  Clinical Social Worker:  Cristy FolksBEST,Sigfredo Schreier, LCSW  Date/Time:  06/11/2013 12:22 PM  Referred by:  Physician  Date Referred:  06/11/2013 Referred for  Other - See comment   Other Referral:   SNF Placement   Interview type:  Patient Other interview type:   Pt.'s daughter Kari Ford(Kari) at bedside.    PSYCHOSOCIAL DATA Living Status:  WITH ADULT CHILDREN Admitted from facility:  N/A Level of care:  N/A Primary support name:  Kari BoardsVivian Ford [phone] Primary support relationship to patient:  CHILD, ADULT Degree of support available:   Good support    CURRENT CONCERNS Current Concerns  Other - See comment   Other Concerns:   SNF Placement    SOCIAL WORK ASSESSMENT / PLAN CSW consult to pt /family regarding possible placement. Pt currently lives with daughter, Kari RalphsVivian, [phone]. Pt.'s daughter reported that it  has been having difficult to care for pt. Pt.'s daughter reported that pt has had numerous falls. Pt./pt.'s daughter asked for a listing of SNF's and if CSW would start the process of finding a placement. [facility] has [facility] which does not require a 3 day- 3 night qualifying stay but would need a prior authorization. CSW has completed FL2 and PSARR. Pt has had a PT eval and will send information to SNF of pt/family's choice. It is the plan for pt to be admitted. Pt/family has been given updated.    Assessment/plan status:  [facility] to [facility] Other assessment/ plan:   Information/referral to community resources:    PATIENT'S/FAMILY'S RESPONSE TO PLAN OF CARE: Pt/family very appreciative of CSW efforts. Pt/family as voiced understanding and is in agreement with plan.

## 2013-06-11 NOTE — Evaluation (Addendum)
Physical Therapy Evaluation Patient Details Name: Kari Ford MRN: [id number] DOB: 02/20/1921 Today's Date: 06/11/2013   History of Present Illness  Patient is a 80 y.o. female presenting with weakness. The history is provided by the patient (pt and daughter state pt is weak and falling.  she is not eating or drinking anything).   Clinical Impression   Pt admitted with above and diagnosis of dehydration. Pt currently with functional limitations due to the deficits listed below (see PT Problem List).  Pt will benefit from skilled PT to increase their independence and safety with mobility to allow discharge to the venue listed below, and decr burden of care.         Follow Up Recommendations SNF    Equipment Recommendations  3in1 (PT)    Recommendations for Other Services OT consult     Precautions / Restrictions Precautions Precautions: Fall Restrictions Weight Bearing Restrictions: No      Mobility  Bed Mobility Overal bed mobility: +2 for physical assistance;Needs Assistance Bed Mobility: Supine to Sit;Sit to Supine     Supine to sit: +2 for physical assistance;Max assist Sit to supine: +2 for safety/equipment;Total assist   General bed mobility comments: Step-by-step cues for technique; Requiring physical assist for all aspects of bed mobility, including assisting LEs off of bed, faciliatating scoot to side of bed, and elevating trunk for m bed for upright sitting; total assist to help LEs back to bed  Transfers Overall transfer level: Needs assistance Equipment used: Rolling walker (2 wheeled) Transfers: Sit to/from Stand Sit to Stand: +2 physical assistance;Max assist         General transfer comment: Cues for technqiue, safety , and hand placement; Pt showing significant weakness, dependent on +2 assist to rise, and bracing backs of LEs on stretcher for stability; Fatigued quickly in standing and required +2 assist to control descent to  sit  Ambulation/Gait                Stairs            Wheelchair Mobility    Modified Rankin (Stroke Patients Only)       Balance Overall balance assessment: Needs assistance Sitting-balance support: Bilateral upper extremity supported Sitting balance-Leahy Scale: Poor Sitting balance - Comments: Noted L eft lean Postural control: Left lateral lean Standing balance support: Bilateral upper extremity supported Standing balance-Leahy Scale: Zero Standing balance comment: Stood at edge of stretcher, with bil support at gait belt; Able to attempt some marching in place, but pt quite fatigued and had to sit back down very shortly after standing                             Pertinent Vitals/Pain no apparent distress     Home Living Family/patient expects to be discharged to:: Private residence Living Arrangements: Children Available Help at Discharge: Family (daughter works days) Type of Home: House Home Access: Stairs to enter Entrance Stairs-Rails: Right Entrance Stairs-Number of Steps: 3-4 Home Layout: Two level Home Equipment: Bedside commode;Cane - single point;Walker - 2 wheels Additional Comments: lives with daughter;  she works a few hrs a day per pt; pt repots she has to go slowly; Pt reports she showers ususally but sponge bathes sometimes; Pt and daughter report more difficulty managing at home with daily falls recently    Prior Function Level of Independence: Needs assistance    Pt's bedroom/bathroom are on second level of her home; She  used a RW to get to/from bathroom ( a short distance, but a functional distance for her) until this past week           Hand Dominance        Extremity/Trunk Assessment   Upper Extremity Assessment: Generalized weakness           Lower Extremity Assessment: Generalized weakness         Communication      Cognition Arousal/Alertness: Awake/alert Behavior During Therapy: WFL for tasks  assessed/performed Overall Cognitive Status: Within Functional Limits for tasks assessed                      General Comments General comments (skin integrity, edema, etc.): Edematous bil ankles with dressings on wounds bilaterally During session, pt said, "There is some strength there", referring to some strength in her LEs    Exercises        Assessment/Plan    PT Assessment Patient needs continued PT services  PT Diagnosis Difficulty walking;Generalized weakness   PT Problem List Decreased strength;Decreased range of motion;Decreased activity tolerance;Decreased balance;Decreased mobility;Decreased coordination;Decreased knowledge of use of DME;Decreased safety awareness;Decreased skin integrity  PT Treatment Interventions DME instruction;Gait training;Functional mobility training;Therapeutic activities;Therapeutic exercise;Balance training;Cognitive remediation;Patient/family education   PT Goals (Current goals can be found in the Care Plan section) Acute Rehab PT Goals Patient Stated Goal: Wants to get stronger PT Goal Formulation: With patient Time For Goal Achievement: 06/25/13 Potential to Achieve Goals: Good    Frequency Min 3X/week   Barriers to discharge Decreased caregiver support Daughter reports difficulty manaing pt's needs    Co-evaluation               End of Session Equipment Utilized During Treatment: Gait belt Activity Tolerance: Patient limited by fatigue Patient left: Other (comment) (back in stretcher; Dr. Donna BernardViyouh present for her exam) Nurse Communication: Mobility status         Time: 1210-1225 PT Time Calculation (min): 15 min   Charges:   PT Evaluation $Initial PT Evaluation Tier I: 1 Procedure PT Treatments $Therapeutic Activity: 8-22 mins   PT G Codes:          [facility] Ham[facility] Ford 06/11/2013, 2:09 PM  Kari Ford, PT  Acute Rehabilitation Services Pager [phone] Office [phone]

## 2013-06-11 NOTE — ED Notes (Signed)
Call PT for eval

## 2013-06-11 NOTE — ED Notes (Signed)
Pt covered in feces. Cleaned pt placed new gown.

## 2013-06-11 NOTE — ED Provider Notes (Signed)
CSN: [id number]     Arrival date & time 06/11/13  [id number] History   First MD Initiated Contact with Patient 06/11/13 0745     Chief Complaint  Patient presents with  . Failure To Thrive     (Consider location/radiation/quality/duration/timing/severity/associated sxs/prior Treatment) Patient is a 80 y.o. female presenting with weakness. The history is provided by the patient (pt and daughter state pt is weak and falling.  she is not eating or drinking anything).  Weakness This is a new problem. The current episode started more than 2 days ago. The problem occurs constantly. The problem has not changed since onset.Pertinent negatives include no chest pain, no abdominal pain and no headaches. Nothing aggravates the symptoms. Nothing relieves the symptoms. She has tried nothing for the symptoms.    Past Medical History  Diagnosis Date  . Diabetes mellitus   . Hypertension   . Thyroid disease   . Hypothyroidism   . Blood transfusion 1952    [facility] [place]  . Seasonal allergies     no meds  . Arthritis     shoulder, back   Past Surgical History  Procedure Laterality Date  . Eye surgery      bilateral - cataract surgery  . Svd      x 1   History reviewed. No pertinent family history. History  Substance Use Topics  . Smoking status: Never Smoker   . Smokeless tobacco: Never Used  . Alcohol Use: No   OB History   Grav Para Term Preterm Abortions TAB SAB Ect Mult Living                 Review of Systems  Constitutional: Positive for fatigue. Negative for appetite change.  HENT: Negative for congestion, ear discharge and sinus pressure.   Eyes: Negative for discharge.  Respiratory: Negative for cough.   Cardiovascular: Negative for chest pain.  Gastrointestinal: Negative for abdominal pain and diarrhea.  Genitourinary: Negative for frequency and hematuria.  Musculoskeletal: Negative for back pain.  Skin: Negative for rash.   Neurological: Positive for weakness. Negative for seizures and headaches.  Psychiatric/Behavioral: Negative for hallucinations.      Allergies  Penicillins  Home Medications   Prior to Admission medications   Medication Sig Start Date End Date Taking? Authorizing Provider  furosemide (LASIX) 20 MG tablet Take 20 mg by mouth daily. 05/30/13  Yes Historical Provider, MD  HUMULIN N 100 UNIT/ML injection Inject 8 Units into the skin 2 (two) times daily.  05/30/13  Yes Historical Provider, MD  HYDROcodone-acetaminophen (NORCO/VICODIN) 5-325 MG per tablet Take 1 tablet by mouth every 4 (four) hours as needed for moderate pain. 06/09/13  Yes Lyanne CoKevin M Campos, MD  irbesartan (AVAPRO) 300 MG tablet Take 300 mg by mouth daily.   Yes Historical Provider, MD  levothyroxine (SYNTHROID, LEVOTHROID) 50 MCG tablet Take 50 mcg by mouth daily.   Yes Historical Provider, MD  metoprolol succinate (TOPROL-XL) 50 MG 24 hr tablet Take 50 mg by mouth daily. 05/30/13  Yes Historical Provider, MD  sulfamethoxazole-trimethoprim (BACTRIM DS) 800-160 MG per tablet Take 1 tablet by mouth 2 (two) times daily. Starting 4/6 for 7 days 06/02/13   Historical Provider, MD   BP 167/45  Pulse 59  Temp(Src) 97.6 F (36.4 C) (Oral)  Resp 14  SpO2 99% Physical Exam  Constitutional: She is oriented to person, place, and time. She appears well-developed.  HENT:  Head: Normocephalic.  Eyes: Conjunctivae  and EOM are normal. No scleral icterus.  Neck: Neck supple. No thyromegaly present.  Cardiovascular: Normal rate and regular rhythm.  Exam reveals no gallop and no friction rub.   No murmur heard. Pulmonary/Chest: No stridor. She has no wheezes. She has no rales. She exhibits no tenderness.  Abdominal: She exhibits no distension. There is no tenderness. There is no rebound.  Musculoskeletal: Normal range of motion. She exhibits edema.  Two plus edema in legs  Lymphadenopathy:    She has no cervical adenopathy.  Neurological: She  is oriented to person, place, and time. She exhibits normal muscle tone. Coordination normal.  Skin: No rash noted. No erythema.  Psychiatric: She has a normal mood and affect. Her behavior is normal.    ED Course  Procedures (including critical care time) Labs Review Labs Reviewed  CBC WITH DIFFERENTIAL - Abnormal; Notable for the following:    Platelets 128 (*)    Neutrophils Relative % 78 (*)    Lymphs Abs 0.6 (*)    All other components within normal limits  COMPREHENSIVE METABOLIC PANEL - Abnormal; Notable for the following:    Sodium 136 (*)    Glucose, Bld 252 (*)    BUN 34 (*)    Albumin 3.4 (*)    AST 60 (*)    GFR calc non Af Amer 45 (*)    GFR calc Af Amer 53 (*)    All other components within normal limits  URINALYSIS, ROUTINE W REFLEX MICROSCOPIC - Abnormal; Notable for the following:    Glucose, UA 100 (*)    Hgb urine dipstick MODERATE (*)    All other components within normal limits  PRO B NATRIURETIC PEPTIDE - Abnormal; Notable for the following:    Pro B Natriuretic peptide (BNP) 842.0 (*)    All other components within normal limits  URINE MICROSCOPIC-ADD ON - Abnormal; Notable for the following:    Bacteria, UA FEW (*)    Casts GRANULAR CAST (*)    All other components within normal limits  TROPONIN I    Imaging Review Ct Head Wo Contrast  06/11/2013   CLINICAL DATA:  Altered mental status  EXAM: CT HEAD WITHOUT CONTRAST  TECHNIQUE: Contiguous axial images were obtained from the base of the skull through the vertex without intravenous contrast. Study was obtained within 24 hr of patient's arrival at the emergency department.  COMPARISON:  February 24, 2013  FINDINGS: There is moderate diffuse atrophy. There is a calcified meningioma in the posterior superior right temporal region measuring 8 x 8 mm without surrounding edema. There is no other evidence of mass. There is no hemorrhage, extra-axial fluid collection, or midline shift. There is moderate small  vessel disease throughout the centra semiovale bilaterally. There is no new gray-white compartment lesion. There is no demonstrable acute infarct.  The bony calvarium appears intact. A small enostosis arising from the left temporal bone measuring 6 x 6 mm is stable. Mastoid air cells on the left are clear. There is opacification of several right-sided mastoid air cells inferiorly, stable.  IMPRESSION: Atrophy with small vessel disease, stable. Small calcified right temporal region meningioma without surrounding edema is stable. This finding is not felt to have clinical significance. 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 There is no intracranial mass, hemorrhage, or acute appearing infarct.   Electronically Signed   By: Bretta BangWilliam  Woodruff M.D.   On: 06/11/2013 09:35   Dg Chest Portable 1 View  06/11/2013   CLINICAL  DATA:  Failure to thrive, altered mental status, hypertension, diabetes  EXAM: PORTABLE CHEST - 1 VIEW  COMPARISON:  Portable exam 0924 hr compared to 06/09/2013  FINDINGS: Upper normal heart size.  Calcified tortuous aorta.  Mediastinal contours pulmonary vascularity normal.  Bronchitic changes with minimal bibasilar atelectasis.  No infiltrate, pleural effusion or pneumothorax.  Bones demineralized.  Chronic right rotator cuff tear.  Scattered degenerative disc disease changes of thoracolumbar spine.  IMPRESSION: Bronchitic changes with mild bibasilar atelectasis.   Electronically Signed   By: Ulyses Southward M.D.   On: 06/11/2013 09:50     EKG Interpretation   Date/Time:  Wednesday June 11 2013 08:55:14 EDT Ventricular Rate:  66 PR Interval:  73 QRS Duration: 84 QT Interval:  448 QTC Calculation: 469 R Axis:   -49 Text Interpretation:  Sinus rhythm Short PR interval Consider left atrial  enlargement Left anterior fascicular block Low voltage, precordial leads  Anteroseptal infarct, old      MDM   Final diagnoses:  Falls  Dehydration        Benny Lennert,  MD 06/11/13 1143

## 2013-06-12 ENCOUNTER — Encounter (HOSPITAL_COMMUNITY): Payer: Self-pay | Admitting: General Practice

## 2013-06-12 DIAGNOSIS — W19XXXA Unspecified fall, initial encounter: Secondary | ICD-10-CM

## 2013-06-12 DIAGNOSIS — I10 Essential (primary) hypertension: Secondary | ICD-10-CM

## 2013-06-12 LAB — GLUCOSE, CAPILLARY
GLUCOSE-CAPILLARY: 106 mg/dL — AB (ref 70–99)
GLUCOSE-CAPILLARY: 113 mg/dL — AB (ref 70–99)
Glucose-Capillary: 181 mg/dL — ABNORMAL HIGH (ref 70–99)
Glucose-Capillary: 172 mg/dL — ABNORMAL HIGH (ref 70–99)

## 2013-06-12 LAB — BASIC METABOLIC PANEL
BUN: 29 mg/dL — ABNORMAL HIGH (ref 6–23)
CALCIUM: 8.2 mg/dL — AB (ref 8.4–10.5)
CO2: 25 meq/L (ref 19–32)
Chloride: 107 mEq/L (ref 96–112)
Creatinine, Ser: 0.86 mg/dL (ref 0.50–1.10)
GFR calc non Af Amer: 58 mL/min — ABNORMAL LOW (ref >90)
GFR, EST AFRICAN AMERICAN: 67 mL/min — AB (ref >90)
Glucose, Bld: 128 mg/dL — ABNORMAL HIGH (ref 70–99)
Potassium: 3.8 mEq/L (ref 3.7–5.3)
SODIUM: 142 meq/L (ref 137–147)

## 2013-06-12 LAB — CBC
HCT: 32.5 % — ABNORMAL LOW (ref 36.0–46.0)
Hemoglobin: 11.1 g/dL — ABNORMAL LOW (ref 12.0–15.0)
MCH: 31 pg (ref 26.0–34.0)
MCHC: 34.2 g/dL (ref 30.0–36.0)
MCV: 90.8 fL (ref 78.0–100.0)
PLATELETS: 129 10*3/uL — AB (ref 150–400)
RBC: 3.58 MIL/uL — AB (ref 3.87–5.11)
RDW: 13.9 % (ref 11.5–15.5)
WBC: 5.5 10*3/uL (ref 4.0–10.5)

## 2013-06-12 LAB — FOLATE RBC: RBC Folate: 584 ng/mL (ref >280)

## 2013-06-12 MED ORDER — GLUCERNA SHAKE PO LIQD
237.0000 mL | Freq: Three times a day (TID) | ORAL | Status: DC
  Administered 2013-06-12 – 2013-06-13 (×2): 237 mL via ORAL

## 2013-06-12 MED ORDER — METOPROLOL SUCCINATE 12.5 MG HALF TABLET
12.5000 mg | ORAL_TABLET | Freq: Every day | ORAL | Status: DC
  Administered 2013-06-13: 12.5 mg via ORAL
  Filled 2013-06-12: qty 1

## 2013-06-12 MED ORDER — PRO-STAT SUGAR FREE PO LIQD
30.0000 mL | Freq: Every day | ORAL | Status: DC
  Administered 2013-06-12 – 2013-06-13 (×2): 30 mL via ORAL
  Filled 2013-06-12 (×3): qty 30

## 2013-06-12 MED ORDER — CYANOCOBALAMIN 1000 MCG/ML IJ SOLN
1000.0000 ug | Freq: Every day | INTRAMUSCULAR | Status: DC
  Administered 2013-06-12 – 2013-06-13 (×2): 1000 ug via SUBCUTANEOUS
  Filled 2013-06-12 (×3): qty 1

## 2013-06-12 MED ORDER — ADULT MULTIVITAMIN W/MINERALS CH
1.0000 | ORAL_TABLET | Freq: Every day | ORAL | Status: DC
  Administered 2013-06-12 – 2013-06-13 (×2): 1 via ORAL
  Filled 2013-06-12 (×3): qty 1

## 2013-06-12 NOTE — Progress Notes (Signed)
  Echocardiogram 2D Echocardiogram has been performed.  Kari MessickLauren A Ford 06/12/2013, 11:56 AM

## 2013-06-12 NOTE — Clinical Social Work Placement (Signed)
    Clinical Social Work Department CLINICAL SOCIAL WORK PLACEMENT NOTE 06/12/2013  Patient:  Kari Ford,Kari Ford  Account Number:  [id number] Admit date:  06/11/2013  Clinical Social Worker:  Gretta CoolZACKARY Justene Jensen, ChiropodistAssistant Director CSW  Date/time:  06/12/2013 02:00 PM  Clinical Social Work is seeking post-discharge placement for this patient at the following level of care:   SKILLED NURSING   (*CSW will update this form in Epic as items are completed)   06/12/2013  Patient/family provided with [facility] Department of Clinical Social Work's list of facilities offering this level of care within the geographic area requested by the patient (or if unable, by the patient's family).  06/12/2013  Patient/family informed of their freedom to choose among providers that offer the needed level of care, that participate in Medicare, Medicaid or managed care program needed by the patient, have an available bed and are willing to accept the patient.  06/12/2013  Patient/family informed of [facility]' ownership interest in [facility], as well as of the fact that they are under no obligation to receive care at this facility.  PASARR submitted to EDS on  PASARR number received from EDS on   FL2 transmitted to all facilities in geographic area requested by pt/family on  06/11/2013 FL2 transmitted to all facilities within larger geographic area on   Patient informed that his/her managed care company has contracts with or will negotiate with  certain facilities, including the following:     Patient/family informed of bed offers received:  06/12/2013 [facility] Physician recommends and patient chooses bed at    Patient to be transferred to  on   Patient to be transferred to facility by   The following physician request were entered in Epic:   Additional Comments:

## 2013-06-12 NOTE — Evaluation (Signed)
Occupational Therapy Evaluation Patient Details Name: Kari Ford MRN: [id number] DOB: 1920-08-12 Today's Date: 06/12/2013    History of Present Illness Patient is a 80 y.o. female presenting with weakness. The history is provided by the patient (pt and daughter state pt is weak and falling.  she is not eating or drinking anything).    Clinical Impression   Pt presents with disorientation to place and time and dependence in all ADL and mobility.  Her affect is quite flat. She can request what she needs when prompted.  Prior to admission, pt was left alone for portions of the day as her daughter worked and could take herself to the bathroom with her RW, groom, self feed and sponge bathed primarily per her report. Pt will need SNF for rehab upon discharge.    Follow Up Recommendations  SNF;Supervision/Assistance - 24 hour    Equipment Recommendations       Recommendations for Other Services       Precautions / Restrictions Precautions Precautions: Fall Restrictions Weight Bearing Restrictions: No      Mobility Bed Mobility Overal bed mobility: Needs Assistance Bed Mobility: Rolling Rolling: Total assist         General bed mobility comments: needed assist to guide hand to rail to assist with rolling.   Transfers                      Balance                                            ADL Overall ADL's : Needs assistance/impaired Eating/Feeding: Total assistance;Bed level   Grooming: Wash/dry hands;Wash/dry face;Total assistance;Bed level   Upper Body Bathing: Total assistance;Bed level   Lower Body Bathing: Total assistance;Bed level   Upper Body Dressing : Total assistance;Bed level   Lower Body Dressing: Total assistance;Bed level       Toileting- Clothing Manipulation and Hygiene: Total assistance;Bed level         General ADL Comments: Aware she needed to have BM, but incontinent prior to bedpan placement     Vision                      Perception     Praxis      Pertinent Vitals/Pain VSS, no pain reported     Hand Dominance Right   Extremity/Trunk Assessment Upper Extremity Assessment Upper Extremity Assessment: Generalized weakness (full PROM, reaches for rail when rolled)   Lower Extremity Assessment Lower Extremity Assessment: Defer to PT evaluation       Communication Communication Communication: No difficulties   Cognition Arousal/Alertness: Awake/alert Behavior During Therapy: Flat affect Overall Cognitive Status: No family/caregiver present to determine baseline cognitive functioning (disoriented to place, thinks she is home)                     General Comments       Exercises       Shoulder Instructions      Home Living Family/patient expects to be discharged to:: Private residence Living Arrangements: Children Available Help at Discharge: Family (daughter works days) Type of Home: House Home Access: Stairs to enter Secretary/administratorntrance Stairs-Number of Steps: 3-4 Entrance Stairs-Rails: Right Home Layout: Two level Alternate Level Stairs-Number of Steps: 12  Alternate Level Stairs-Rails: Right Bathroom Shower/Tub: Other (comment) (pt sponge bathes)  Bathroom Toilet: Handicapped height Bathroom Accessibility: Yes How Accessible: Accessible via walker Home Equipment: Bedside commode;Cane - single point;Walker - 2 wheels   Additional Comments: Pt reports she sponge bathes and takes herself to the bathroom using a walker while her daughter is at work.      Prior Functioning/Environment Level of Independence: Needs assistance             OT Diagnosis: Generalized weakness;Cognitive deficits   OT Problem List: Decreased strength;Decreased activity tolerance;Impaired balance (sitting and/or standing);Decreased coordination;Decreased cognition;Decreased knowledge of use of DME or AE;Impaired UE functional use   OT Treatment/Interventions: Self-care/ADL  training;Therapeutic exercise;Therapeutic activities;Patient/family education;Balance training    OT Goals(Current goals can be found in the care plan section) Acute Rehab OT Goals Patient Stated Goal: Wants to get stronger OT Goal Formulation: Patient unable to participate in goal setting Time For Goal Achievement: 06/26/13 Potential to Achieve Goals: Fair ADL Goals Pt Will Perform Eating: with min assist;sitting;bed level Pt Will Perform Grooming: with min assist;sitting Pt Will Transfer to Toilet: with mod assist;stand pivot transfer;bedside commode Pt/caregiver will Perform Home Exercise Program: Both right and left upper extremity;With written HEP provided;With minimal assist (A/AAROM B UEs 10 reps x 2) Additional ADL Goal #1: Pt will sit unsupported at EOB x 10 minutes with min assist as precursor to ADL.  OT Frequency: Min 2X/week   Barriers to D/C: Decreased caregiver support;Inaccessible home environment  family is having difficulty caring for pt at home, falls       Co-evaluation              End of Session Nurse Communication:  (had BM)  Activity Tolerance: Patient tolerated treatment well Patient left: in bed;with call bell/phone within reach;with nursing/sitter in room   Time: [id number] OT Time Calculation (min): 31 min Charges:  OT General Charges $OT Visit: 1 Procedure OT Evaluation $Initial OT Evaluation Tier I: 1 Procedure OT Treatments $Self Care/Home Management : 8-22 mins G-Codes: OT G-codes **NOT FOR INPATIENT CLASS** Functional Assessment Tool Used: clinical judgement Functional Limitation: Self care Self Care Current Status ([id number]): 100 percent impaired, limited or restricted Self Care Goal Status ([id number]): At least 60 percent but less than 80 percent impaired, limited or restricted  [place]ton BailiffJulie Lynn Neema Barreira 06/12/2013, 9:39 AM [phone]

## 2013-06-12 NOTE — Progress Notes (Signed)
PROGRESS NOTE  Kari Ford WGN:[id number] DOB: August 23, 1920 DOA: 06/11/2013 PCP: Allean FoundSMITH,CANDACE THIELE, MD  Assessment/Plan: Falls frequently  CT ok Will check MRI  TSH ok  B12 lower end of normal- will replace folic level  -PT/OT- SNF  Peripheral edema  -BNP on 4/13 was elevated, she has no prior history of CHF  -Will obtain echo, hold off on Lasix and given her elevated BUN   Volume depletion  -Hold Lasix for now follow, recheck the meds and further treat accordingly  -Will avoid IV fluids for now given her peripheral edema as above   Type II or unspecified type diabetes mellitus without mention of complication, not stated as uncontrolled  -Continue NPH monitor Accu-Cheks and cover with sliding scale   Essential hypertension, benign  -Continue outpatient medications   Unspecified hypothyroidism  -TSH ok  Recent cellulitis  -She status post completion of full course of antibiotics.      Code Status: full Family Communication: daughter on phone Disposition Plan: SNF   Consultants:  none  Procedures:  none  Antibiotics:  none  HPI/Subjective: Daughter worried that patient had a stroke   Objective: Filed Vitals:   06/12/13 0541  BP: 134/43  Pulse: 55  Temp: 98.2 F (36.8 C)  Resp: 15    Intake/Output Summary (Last 24 hours) at 06/12/13 0749 Last data filed at 06/12/13 0600  Gross per 24 hour  Intake    236 ml  Output    750 ml  Net   -514 ml   Filed Weights   06/11/13 1345 06/12/13 0541  Weight: 63.5 kg (139 lb 15.9 oz) 65.4 kg (144 lb 2.9 oz)    Exam:   General:  Pleasant/cooperative  Cardiovascular: rrr  Respiratory: clear anterior  Abdomen: +BS, soft  Musculoskeletal: moves all 4 ext   Data Reviewed: Basic Metabolic Panel:  Recent Labs Lab 06/09/13 0304 06/11/13 0835 06/12/13 0401  NA 134* 136* 142  K 4.4 4.7 3.8  CL 96 96 107  CO2 27 25 25   GLUCOSE 123* 252* 128*  BUN 21 34* 29*  CREATININE 1.18* 1.05 0.86    CALCIUM 8.8 8.7 8.2*   Liver Function Tests:  Recent Labs Lab 06/09/13 0304 06/11/13 0835  AST 16 60*  ALT 9 26  ALKPHOS 91 84  BILITOT 0.4 0.5  PROT 6.8 6.8  ALBUMIN 3.5 3.4*   No results found for this basename: LIPASE, AMYLASE,  in the last 168 hours No results found for this basename: AMMONIA,  in the last 168 hours CBC:  Recent Labs Lab 06/09/13 0304 06/11/13 0835 06/11/13 1540 06/12/13 0401  WBC 2.8* 4.6 5.4 5.5  NEUTROABS 1.8 3.5  --   --   HGB 13.0 13.1 12.3 11.1*  HCT 38.0 37.4 35.6* 32.5*  MCV 91.8 89.9 89.9 90.8  PLT 132* 128* 128* 129*   Cardiac Enzymes:  Recent Labs Lab 06/11/13 0835  TROPONINI <0.30   BNP (last 3 results)  Recent Labs  06/11/13 0835  PROBNP 842.0*   CBG:  Recent Labs Lab 06/11/13 1350 06/11/13 1657 06/11/13 2127 06/12/13 0746  GLUCAP 166* 273* 83 106*    No results found for this or any previous visit (from the past 240 hour(s)).   Studies: Ct Head Wo Contrast  06/11/2013   CLINICAL DATA:  Altered mental status  EXAM: CT HEAD WITHOUT CONTRAST  TECHNIQUE: Contiguous axial images were obtained from the base of the skull through the vertex without intravenous contrast. Study was obtained within  24 hr of patient's arrival at the emergency department.  COMPARISON:  February 24, 2013  FINDINGS: There is moderate diffuse atrophy. There is a calcified meningioma in the posterior superior right temporal region measuring 8 x 8 mm without surrounding edema. There is no other evidence of mass. There is no hemorrhage, extra-axial fluid collection, or midline shift. There is moderate small vessel disease throughout the centra semiovale bilaterally. There is no new gray-white compartment lesion. There is no demonstrable acute infarct.  The bony calvarium appears intact. A small enostosis arising from the left temporal bone measuring 6 x 6 mm is stable. Mastoid air cells on the left are clear. There is opacification of several right-sided  mastoid air cells inferiorly, stable.  IMPRESSION: Atrophy with small vessel disease, stable. Small calcified right temporal region meningioma without surrounding edema is stable. This finding is not felt to have clinical significance. 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 There is no intracranial mass, hemorrhage, or acute appearing infarct.   Electronically Signed   By: Bretta BangWilliam  Woodruff M.D.   On: 06/11/2013 09:35   Dg Chest Portable 1 View  06/11/2013   CLINICAL DATA:  Failure to thrive, altered mental status, hypertension, diabetes  EXAM: PORTABLE CHEST - 1 VIEW  COMPARISON:  Portable exam 0924 hr compared to 06/09/2013  FINDINGS: Upper normal heart size.  Calcified tortuous aorta.  Mediastinal contours pulmonary vascularity normal.  Bronchitic changes with minimal bibasilar atelectasis.  No infiltrate, pleural effusion or pneumothorax.  Bones demineralized.  Chronic right rotator cuff tear.  Scattered degenerative disc disease changes of thoracolumbar spine.  IMPRESSION: Bronchitic changes with mild bibasilar atelectasis.   Electronically Signed   By: Ulyses SouthwardMark  Boles M.D.   On: 06/11/2013 09:50    Scheduled Meds: . enoxaparin (LOVENOX) injection  40 mg Subcutaneous Q24H  . insulin aspart  0-15 Units Subcutaneous TID WC  . insulin NPH Human  8 Units Subcutaneous QAC breakfast  . insulin NPH Human  8 Units Subcutaneous QHS  . irbesartan  300 mg Oral Daily  . levothyroxine  50 mcg Oral Daily  . metoprolol succinate  50 mg Oral Daily  . sodium chloride  3 mL Intravenous Q12H   Continuous Infusions:  Antibiotics Given (last 72 hours)   None      Active Problems:   Type II or unspecified type diabetes mellitus without mention of complication, not stated as uncontrolled   Essential hypertension, benign   Unspecified hypothyroidism   Dehydration   Falls   Falls frequently    Time spent: 35 min    [name]  [facility] Pager [phone]. If 7PM-7AM, please contact  night-coverage at [email or web address], password [email or web address] 06/12/2013, 7:49 AM  LOS: 1 day

## 2013-06-12 NOTE — Progress Notes (Signed)
UR completed 

## 2013-06-12 NOTE — Progress Notes (Signed)
Clinical Social Worker spoke with Ms. Kari Ford's daughter at length about the nursing home process and long term care. The daughter has accepted the bed offer at [facility]. We will notify the facility for insurance authorization purposes and assess for bed availability. Daughter will apply for Medicaid which will open up more long term care possibilities for her mother.  Kari CoolZackary Delita Chiquito, LCSW Assistant Director Clinical Social Work Department

## 2013-06-12 NOTE — Progress Notes (Signed)
INITIAL NUTRITION ASSESSMENT  DOCUMENTATION CODES Per approved criteria  -Not Applicable   INTERVENTION: Glucerna Shake po TID with meals, each supplement provides 220 kcal and 10 grams of protein  30 mL Prostat once daily, providing 100 kcals and 15 grams of protein  Add multivitamin with minerals  RD to continue to follow nutrition care plan   NUTRITION DIAGNOSIS: Inadequate oral intake related to poor appetite as evidenced by pt report of no appetite and 50% meal completion in chart.   Goal: Meet >/=90% of estimated nutrition needs   Monitor:  PO intake, weight trends, labs, supplement acceptance   Reason for Assessment: Positive Malnutrition Screening Tool Score   80 y.o. female  Admitting Dx: <principal problem not specified>  ASSESSMENT: 80 y.o. female history of hypertension, diabetes, hypothyroidism, recently treated for cellulitis who lives at home with her daughter. Pt has recently had a problem with falls daily this past week.   Unable to obtain accurate weight history from pt due to mental status. Pt was very tired and did not give a very detailed diet recall. The diet recall included sausage biscuits and sandwiches.   Pt stated that she would like to receive Glucerna shakes with her meals. She reports not to have a good appetite but unable to assess how much she has been eating at home recently. Will continue to monitor PO intake to assess nutritional status.   Pt has been eating 50% of meals per RN notes. Nutrition focused physical exam did not reveal any depletion of muscle mass or fat stores. Pt does have mild edema in the left leg.   Elevated Glucose, BUN   Height: Ht Readings from Last 1 Encounters:  06/11/13 5\' 4"  (1.626 m)    Weight: Wt Readings from Last 1 Encounters:  06/12/13 144 lb 2.9 oz (65.4 kg)    Ideal Body Weight: 120 lbs  % Ideal Body Weight: 120%   Wt Readings from Last 10 Encounters:  06/12/13 144 lb 2.9 oz (65.4 kg)  06/02/13 137  lb (62.143 kg)  08/30/12 135 lb (61.236 kg)  06/08/11 160 lb (72.576 kg)    Usual Body Weight: 130-140 lbs  % Usual Body Weight: 100%   BMI:  Body mass index is 24.74 kg/(m^2).  Estimated Nutritional Needs: Kcal: 1400 - 1600 Protein: 70- 80 grams Fluid: >/=1.5 L/day   Skin:  Stage II Pressure Ulcer Left,Right Buttocks Skin tears R & L Left Skin tears L arm  Diet Order:  Heart Healthy/ Carb Modified   EDUCATION NEEDS: -No education needs identified at this time   Intake/Output Summary (Last 24 hours) at 06/12/13 1537 Last data filed at 06/12/13 1500  Gross per 24 hour  Intake    596 ml  Output   1050 ml  Net   -454 ml    Last BM: PTA   Labs:   Recent Labs Lab 06/09/13 0304 06/11/13 0835 06/12/13 0401  NA 134* 136* 142  K 4.4 4.7 3.8  CL 96 96 107  CO2 27 25 25   BUN 21 34* 29*  CREATININE 1.18* 1.05 0.86  CALCIUM 8.8 8.7 8.2*  GLUCOSE 123* 252* 128*    CBG (last 3)   Recent Labs  06/11/13 2127 06/12/13 0746 06/12/13 1226  GLUCAP 83 106* 181*   Lab Results  Component Value Date   HGBA1C 5.9* 08/30/2012    Scheduled Meds: . cyanocobalamin  1,000 mcg Subcutaneous Daily  . enoxaparin (LOVENOX) injection  40 mg Subcutaneous Q24H  .  insulin aspart  0-15 Units Subcutaneous TID WC  . insulin NPH Human  8 Units Subcutaneous QAC breakfast  . insulin NPH Human  8 Units Subcutaneous QHS  . irbesartan  300 mg Oral Daily  . levothyroxine  50 mcg Oral Daily  . [START ON 06/13/2013] metoprolol succinate  12.5 mg Oral Daily  . sodium chloride  3 mL Intravenous Q12H    Continuous Infusions:   Past Medical History  Diagnosis Date  . Diabetes mellitus   . Hypertension   . Thyroid disease   . Hypothyroidism   . Blood transfusion 1952    [facility] [place]  . Seasonal allergies     no meds  . Arthritis     shoulder, back    Past Surgical History  Procedure Laterality Date  . Eye surgery      bilateral -  cataract surgery  . Svd      x 1  . Cyst excision      uterine     Eppie Gibsonebekah L Orlandria Kissner, BS Nutrition Intern Pager: [phone]

## 2013-06-12 NOTE — Progress Notes (Signed)
Agree with dietetic intern note and intervention. Charle Clear Barnett RD, LDN Inpatient Clinical Dietitian Pager: [phone] After Hours Pager: [phone]  

## 2013-06-12 NOTE — Progress Notes (Signed)
Patient has been sleeping all day, arousable . MD made aware of patient LOC. Will continue to monitor.

## 2013-06-13 ENCOUNTER — Observation Stay (HOSPITAL_COMMUNITY): Payer: Medicare PPO

## 2013-06-13 LAB — GLUCOSE, CAPILLARY
GLUCOSE-CAPILLARY: 125 mg/dL — AB (ref 70–99)
GLUCOSE-CAPILLARY: 261 mg/dL — AB (ref 70–99)

## 2013-06-13 MED ORDER — INSULIN NPH (HUMAN) (ISOPHANE) 100 UNIT/ML ~~LOC~~ SUSP: 8.0000 [IU] | Freq: Every day | SUBCUTANEOUS | Status: DC

## 2013-06-13 MED ORDER — PRO-STAT SUGAR FREE PO LIQD: 30.0000 mL | Freq: Every day | ORAL | Status: DC

## 2013-06-13 MED ORDER — ADULT MULTIVITAMIN W/MINERALS CH: 1.0000 | ORAL_TABLET | Freq: Every day | ORAL | Status: DC

## 2013-06-13 MED ORDER — TRAMADOL HCL 50 MG PO TABS: 25.0000 mg | ORAL_TABLET | Freq: Four times a day (QID) | ORAL | Status: DC | PRN

## 2013-06-13 MED ORDER — CYANOCOBALAMIN 1000 MCG/ML IJ SOLN: 1000.0000 ug | Freq: Every day | INTRAMUSCULAR | Status: DC

## 2013-06-13 MED ORDER — METOPROLOL SUCCINATE 12.5 MG HALF TABLET: 12.5000 mg | ORAL_TABLET | Freq: Every day | ORAL | Status: DC

## 2013-06-13 MED ORDER — INSULIN ASPART 100 UNIT/ML ~~LOC~~ SOLN: 0.0000 [IU] | Freq: Three times a day (TID) | SUBCUTANEOUS | Status: DC

## 2013-06-13 MED ORDER — AMLODIPINE BESYLATE 5 MG PO TABS: 5.0000 mg | ORAL_TABLET | Freq: Every day | ORAL | Status: DC

## 2013-06-13 MED ORDER — GLUCERNA SHAKE PO LIQD: 237.0000 mL | Freq: Three times a day (TID) | ORAL | Status: DC

## 2013-06-13 NOTE — Clinical Social Work Note (Signed)
[facility] daughter and patient aware and pleased- planning dc later today via EMS.  Reece LevyJanet Tavi Gaughran, MSW, Theresia MajorsLCSWA [phone]

## 2013-06-13 NOTE — Discharge Summary (Signed)
Physician Discharge Summary  Kari Ford ZOX:[id number] DOB: [birth year] DOA: 06/11/2013  PCP: Allean FoundSMITH,CANDACE THIELE, MD  Admit date: 06/11/2013 Discharge date: 06/13/2013  Time spent: 35 minutes  Recommendations for Outpatient Follow-up:  1. Further titration of BP meds 2. Monitor of blood sugar 3. SQ B12 x 6 more days then weekly  Discharge Diagnoses:  Active Problems:   Type II or unspecified type diabetes mellitus without mention of complication, not stated as uncontrolled   Essential hypertension, benign   Unspecified hypothyroidism   Dehydration   Falls   Falls frequently   Discharge Condition: improved  Diet recommendation: cardiac/diabetic  Filed Weights   06/11/13 1345 06/12/13 0541 06/13/13 0500  Weight: 63.5 kg (139 lb 15.9 oz) 65.4 kg (144 lb 2.9 oz) 66.2 kg (145 lb 15.1 oz)    History of present illness:  Kari Ford is a 80 y.o. female history of hypertension, diabetes, hypothyroidism, recently treated for cellulitis who lives at home with her daughter and presents with above complaints. Her daughter is at the bedside assisting with the history and states that for a good while she has had a problem with falls, but this has worsened in the past week. Her daughter states that she has been falling daily in the past week -"every time she gets up she falls". It is noted that she was seen in the ED on 4/13 with complaints of pain following one of her falls and imaging studies were negative for fractures, CT scan of head showed a stable small calcified meningioma without surrounding edema, and no hemorrhage or infarct noted. Patient states she had been started on some pain medications about a week ago and she states that she is felt dizzy in the past couple of days which she takes for pain meds. Her daughter also reports some intermittent confusion in the past week, but she is lucid the ED. She reports that the leg swelling that she has had this improved since she was a  treated with antibiotics for cellulitis. She denies syncope, and no shortness of breath. She was seen in the ED and chemistries were within normal limits except for an elevated BUN of 34, urinalysis was unremarkable. Patient was unable to ambulate in ED and her daughter is unable to take care of her at home at this time. She is admitted for further evaluation and management.   Hospital Course:  Falls frequently  CT ok  MRI shows chronic changes TSH ok  B12 lower end of normal- will replace  folic level  -PT/OT- SNF   Peripheral edema  -BNP on 4/13 was elevated, she has no prior history of CHF  -echo: Left ventricle: The cavity size was normal. There was moderate concentric hypertrophy. Systolic function was vigorous. The estimated ejection fraction was in the range of 65% to 70%. Wall motion was normal; there were no regional wall motion abnormalities. - Aortic valve: Calcified annulus. Trileaflet; normal thickness leaflets. - Mitral valve: Mildly to moderately calcified annulus. Mildly calcified leaflets . Mild regurgitation. - Left atrium: The atrium was mildly dilated.    Type II or unspecified type diabetes mellitus without mention of complication, not stated as uncontrolled  -Continue NPH monitor Accu-Cheks and cover with sliding scale   Essential hypertension, benign  -add norvasc and decrease BB as HR low  Unspecified hypothyroidism  -TSH ok   Recent cellulitis  -She status post completion of full course of antibiotics.    Procedures:  echo  Consultations:  none  Discharge  Exam: Filed Vitals:   06/13/13 0458  BP: 164/52  Pulse: 66  Temp: 97.8 F (36.6 C)  Resp: 18    General: pleasant/cooperative Cardiovascular: rrr Respiratory: clear anterior  Discharge Instructions You were cared for by a hospitalist during your hospital stay. If you have any questions about your discharge medications or the care you received while you were in the hospital after  you are discharged, you can call the unit and asked to speak with the hospitalist on call if the hospitalist that took care of you is not available. Once you are discharged, your primary care physician will handle any further medical issues. Please note that NO REFILLS for any discharge medications will be authorized once you are discharged, as it is imperative that you return to your primary care physician (or establish a relationship with a primary care physician if you do not have one) for your aftercare needs so that they can reassess your need for medications and monitor your lab values.      Discharge Orders   Future Orders Complete By Expires   Diet - low sodium heart healthy  As directed    Diet Carb Modified  As directed    Discharge instructions  As directed    Increase activity slowly  As directed        Medication List    STOP taking these medications       furosemide 20 MG tablet  Commonly known as:  LASIX     HYDROcodone-acetaminophen 5-325 MG per tablet  Commonly known as:  NORCO/VICODIN     sulfamethoxazole-trimethoprim 800-160 MG per tablet  Commonly known as:  BACTRIM DS      TAKE these medications       amLODipine 5 MG tablet  Commonly known as:  NORVASC  Take 1 tablet (5 mg total) by mouth daily.     cyanocobalamin 1000 MCG/ML injection  Commonly known as:  (VITAMIN B-12)  Inject 1 mL (1,000 mcg total) into the skin daily.     feeding supplement (GLUCERNA SHAKE) Liqd  Take 237 mLs by mouth 3 (three) times daily with meals.     feeding supplement (PRO-STAT SUGAR FREE 64) Liqd  Take 30 mLs by mouth daily.     insulin aspart 100 UNIT/ML injection  Commonly known as:  novoLOG  Inject 0-15 Units into the skin 3 (three) times daily with meals.     insulin NPH Human 100 UNIT/ML injection  Commonly known as:  HUMULIN N,NOVOLIN N  Inject 0.08 mLs (8 Units total) into the skin daily before breakfast.     insulin NPH Human 100 UNIT/ML injection  Commonly  known as:  HUMULIN N,NOVOLIN N  Inject 0.08 mLs (8 Units total) into the skin at bedtime.     irbesartan 300 MG tablet  Commonly known as:  AVAPRO  Take 300 mg by mouth daily.     levothyroxine 50 MCG tablet  Commonly known as:  SYNTHROID, LEVOTHROID  Take 50 mcg by mouth daily.     metoprolol succinate 12.5 mg Tb24 24 hr tablet  Commonly known as:  TOPROL-XL  Take 0.5 tablets (12.5 mg total) by mouth daily.     multivitamin with minerals Tabs tablet  Take 1 tablet by mouth daily.     traMADol 50 MG tablet  Commonly known as:  ULTRAM  Take 0.5 tablets (25 mg total) by mouth every 6 (six) hours as needed for moderate pain.       Allergies  Allergen Reactions  . Penicillins Hives      The results of significant diagnostics from this hospitalization (including imaging, microbiology, ancillary and laboratory) are listed below for reference.    Significant Diagnostic Studies: Dg Cervical Spine Complete  06/09/2013   CLINICAL DATA:  Multiple falls  EXAM: CERVICAL SPINE  4+ VIEWS  COMPARISON:  CT C SPINE W/O CM dated 02/24/2013  FINDINGS: The cervical spine is visualized to the level of C6-7.  The vertebral body heights are maintained. The alignment is normal. The prevertebral soft tissues are normal. There is no acute fracture or static listhesis. There is degenerative disc disease at C5-6 and C6-7.  IMPRESSION: No acute osseous injury of the cervical spine.   Electronically Signed   By: Elige KoHetal  Patel   On: 06/09/2013 03:58   Ct Head Wo Contrast  06/11/2013   CLINICAL DATA:  Altered mental status  EXAM: CT HEAD WITHOUT CONTRAST  TECHNIQUE: Contiguous axial images were obtained from the base of the skull through the vertex without intravenous contrast. Study was obtained within 24 hr of patient's arrival at the emergency department.  COMPARISON:  February 24, 2013  FINDINGS: There is moderate diffuse atrophy. There is a calcified meningioma in the posterior superior right temporal  region measuring 8 x 8 mm without surrounding edema. There is no other evidence of mass. There is no hemorrhage, extra-axial fluid collection, or midline shift. There is moderate small vessel disease throughout the centra semiovale bilaterally. There is no new gray-white compartment lesion. There is no demonstrable acute infarct.  The bony calvarium appears intact. A small enostosis arising from the left temporal bone measuring 6 x 6 mm is stable. Mastoid air cells on the left are clear. There is opacification of several right-sided mastoid air cells inferiorly, stable.  IMPRESSION: Atrophy with small vessel disease, stable. Small calcified right temporal region meningioma without surrounding edema is stable. This finding is not felt to have clinical significance. 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 There is no intracranial mass, hemorrhage, or acute appearing infarct.   Electronically Signed   By: Bretta BangWilliam  Woodruff M.D.   On: 06/11/2013 09:35   Mr Brain Wo Contrast  06/13/2013   CLINICAL DATA:  Progressive falls.  EXAM: MRI HEAD WITHOUT CONTRAST  TECHNIQUE: Multiplanar, multiecho pulse sequences of the brain and surrounding structures were obtained without intravenous contrast.  COMPARISON:  CT head without contrast 06/11/2013  FINDINGS: Moderate generalized atrophy and white matter disease is present bilaterally. No acute infarct, hemorrhage, or mass lesion is present. Ventricles are proportionate to the degree of atrophy. No significant extra-axial fluid collection is present.  Flow is present in the major intracranial arteries. The patient is status post bilateral lens replacements. The paranasal sinuses are clear. There is fluid in the right mastoid air cells. No obstructing nasopharyngeal lesion is evident.  IMPRESSION: 1. Moderate generalized atrophy and white matter disease. This is nonspecific, but likely reflects the sequelae of chronic microvascular ischemia. 2. No acute intracranial  abnormality. 3. Right mastoid effusion. No obstructing nasopharyngeal lesion is evident.   Electronically Signed   By: Gennette Pachris  Mattern M.D.   On: 06/13/2013 10:32   Dg Chest Portable 1 View  06/11/2013   CLINICAL DATA:  Failure to thrive, altered mental status, hypertension, diabetes  EXAM: PORTABLE CHEST - 1 VIEW  COMPARISON:  Portable exam 0924 hr compared to 06/09/2013  FINDINGS: Upper normal heart size.  Calcified tortuous aorta.  Mediastinal contours pulmonary vascularity normal.  Bronchitic changes  with minimal bibasilar atelectasis.  No infiltrate, pleural effusion or pneumothorax.  Bones demineralized.  Chronic right rotator cuff tear.  Scattered degenerative disc disease changes of thoracolumbar spine.  IMPRESSION: Bronchitic changes with mild bibasilar atelectasis.   Electronically Signed   By: Ulyses Southward M.D.   On: 06/11/2013 09:50   Dg Abd Acute W/chest  06/09/2013   CLINICAL DATA:  Abdominal pain  EXAM: ACUTE ABDOMEN SERIES (ABDOMEN 2 VIEW & CHEST 1 VIEW)  COMPARISON:  None.  FINDINGS: There are mild bilateral chronic bronchitic changes. There is no focal consolidation, pleural effusion or pneumothorax. The heart and mediastinal contours are unremarkable. The osseous structures are unremarkable.  There is no bowel dilatation to suggest obstruction. There are no air-fluid levels. There is no evidence of pneumoperitoneum, portal venous gas, or pneumatosis.  There is osteoarthritis of the right glenohumeral joint. There is lower lumbar spine spondylosis. There is osteoarthritis of bilateral hips.  IMPRESSION: Negative abdominal radiographs.  No acute cardiopulmonary disease.   Electronically Signed   By: Elige Ko   On: 06/09/2013 04:00    Microbiology: No results found for this or any previous visit (from the past 240 hour(s)).   Labs: Basic Metabolic Panel:  Recent Labs Lab 06/09/13 0304 06/11/13 0835 06/12/13 0401  NA 134* 136* 142  K 4.4 4.7 3.8  CL 96 96 107  CO2 27 25 25    GLUCOSE 123* 252* 128*  BUN 21 34* 29*  CREATININE 1.18* 1.05 0.86  CALCIUM 8.8 8.7 8.2*   Liver Function Tests:  Recent Labs Lab 06/09/13 0304 06/11/13 0835  AST 16 60*  ALT 9 26  ALKPHOS 91 84  BILITOT 0.4 0.5  PROT 6.8 6.8  ALBUMIN 3.5 3.4*   No results found for this basename: LIPASE, AMYLASE,  in the last 168 hours No results found for this basename: AMMONIA,  in the last 168 hours CBC:  Recent Labs Lab 06/09/13 0304 06/11/13 0835 06/11/13 1540 06/12/13 0401  WBC 2.8* 4.6 5.4 5.5  NEUTROABS 1.8 3.5  --   --   HGB 13.0 13.1 12.3 11.1*  HCT 38.0 37.4 35.6* 32.5*  MCV 91.8 89.9 89.9 90.8  PLT 132* 128* 128* 129*   Cardiac Enzymes:  Recent Labs Lab 06/11/13 0835  TROPONINI <0.30   BNP: BNP (last 3 results)  Recent Labs  06/11/13 0835  PROBNP 842.0*   CBG:  Recent Labs Lab 06/12/13 0746 06/12/13 1226 06/12/13 1719 06/12/13 2140 06/13/13 0730  GLUCAP 106* 181* 113* 172* 125*       Signed:  Joseph Art  [facility] 06/13/2013, 11:01 AM

## 2013-06-13 NOTE — Clinical Social Work Placement (Signed)
Clinical Social Work Department CLINICAL SOCIAL WORK PLACEMENT NOTE 06/13/2013  Patient:  Kari Ford,Kari Ford  Account Number:  [id number] Admit date:  06/11/2013  Clinical Social Worker:  Gretta CoolZACKARY BROOKS, ChiropodistAssistant Director CSW  Date/time:  06/12/2013 02:00 PM  Clinical Social Work is seeking post-discharge placement for this patient at the following level of care:   SKILLED NURSING   (*CSW will update this form in Epic as items are completed)   06/12/2013  Patient/family provided with [facility] Department of Clinical Social Work's list of facilities offering this level of care within the geographic area requested by the patient (or if unable, by the patient's family).  06/12/2013  Patient/family informed of their freedom to choose among providers that offer the needed level of care, that participate in Medicare, Medicaid or managed care program needed by the patient, have an available bed and are willing to accept the patient.  06/12/2013  Patient/family informed of [facility]' ownership interest in [facility], as well as of the fact that they are under no obligation to receive care at this facility.  PASARR submitted to EDS on  PASARR number received from EDS on   FL2 transmitted to all facilities in geographic area requested by pt/family on  06/11/2013 FL2 transmitted to all facilities within larger geographic area on   Patient informed that his/her managed care company has contracts with or will negotiate with  certain facilities, including the following:     Patient/family informed of bed offers received:  06/12/2013 [facility] Physician recommends and patient chooses bed at    [facility] on  06/13/2013 Patient to be transferred to facility by   The following physician request were entered in Epic:   Additional Comments: Reece LevyJanet Kristalyn Bergstresser, MSW,  Theresia MajorsLCSWA [phone]

## 2013-06-13 NOTE — Progress Notes (Addendum)
[facility]. Alert , pleasantly confused, not in any distress.Report given to Aram Beechamynthia, RN from [facility].

## 2013-06-13 NOTE — Clinical Social Work Note (Signed)
Patient for d/c today to SNF bed at [facility]. Family and patient agreeable to this plan- plan transfer via EMS. Reece LevyJanet Gratia Disla, MSW, Theresia MajorsLCSWA [phone]

## 2013-06-15 NOTE — Progress Notes (Signed)
Physical Therapy  Note  (late entry for G Code correction)   06/15/13 0700  PT G-Codes **NOT FOR INPATIENT CLASS**  Functional Assessment Tool Used Clinical Judgement  Functional Limitation Mobility: Walking and moving around  Mobility: Walking and Moving Around Current Status (W0981(G8978) CL  Mobility: Walking and Moving Around Goal Status (X9147(G8979) CI   Kari Ford, PT  Acute Rehabilitation Services Pager [phone] Office [phone]

## 2013-08-04 ENCOUNTER — Encounter (HOSPITAL_COMMUNITY): Payer: Self-pay | Admitting: Emergency Medicine

## 2013-08-04 ENCOUNTER — Emergency Department (HOSPITAL_COMMUNITY)
Admission: EM | Admit: 2013-08-04 | Discharge: 2013-08-04 | Disposition: A | Discharge status: Home / Self Care | Payer: Medicare PPO | Attending: Emergency Medicine | Admitting: Emergency Medicine

## 2013-08-04 DIAGNOSIS — Z79899 Other long term (current) drug therapy: Secondary | ICD-10-CM | POA: Insufficient documentation

## 2013-08-04 DIAGNOSIS — A0472 Enterocolitis due to Clostridium difficile, not specified as recurrent: Secondary | ICD-10-CM | POA: Insufficient documentation

## 2013-08-04 DIAGNOSIS — E039 Hypothyroidism, unspecified: Secondary | ICD-10-CM | POA: Insufficient documentation

## 2013-08-04 DIAGNOSIS — I10 Essential (primary) hypertension: Secondary | ICD-10-CM | POA: Insufficient documentation

## 2013-08-04 DIAGNOSIS — Z139 Encounter for screening, unspecified: Secondary | ICD-10-CM

## 2013-08-04 DIAGNOSIS — Z794 Long term (current) use of insulin: Secondary | ICD-10-CM | POA: Insufficient documentation

## 2013-08-04 DIAGNOSIS — Z008 Encounter for other general examination: Secondary | ICD-10-CM | POA: Insufficient documentation

## 2013-08-04 DIAGNOSIS — E119 Type 2 diabetes mellitus without complications: Secondary | ICD-10-CM | POA: Insufficient documentation

## 2013-08-04 DIAGNOSIS — Z880 Allergy status to penicillin: Secondary | ICD-10-CM | POA: Insufficient documentation

## 2013-08-04 DIAGNOSIS — Z8739 Personal history of other diseases of the musculoskeletal system and connective tissue: Secondary | ICD-10-CM | POA: Insufficient documentation

## 2013-08-04 HISTORY — DX: Enterocolitis due to Clostridium difficile, not specified as recurrent: A04.72

## 2013-08-04 LAB — BASIC METABOLIC PANEL
BUN: 16 mg/dL (ref 6–23)
CO2: 29 mEq/L (ref 19–32)
Calcium: 8.4 mg/dL (ref 8.4–10.5)
Chloride: 102 mEq/L (ref 96–112)
Creatinine, Ser: 0.78 mg/dL (ref 0.50–1.10)
GFR calc Af Amer: 83 mL/min — ABNORMAL LOW (ref >90)
GFR calc non Af Amer: 71 mL/min — ABNORMAL LOW (ref >90)
Glucose, Bld: 187 mg/dL — ABNORMAL HIGH (ref 70–99)
Potassium: 3.8 mEq/L (ref 3.7–5.3)
Sodium: 139 mEq/L (ref 137–147)

## 2013-08-04 LAB — CBG MONITORING, ED: GLUCOSE-CAPILLARY: 148 mg/dL — AB (ref 70–99)

## 2013-08-04 NOTE — ED Notes (Signed)
Bed: [facility] Expected date:  Expected time:  Means of arrival:  Comments: EMS- elderly, C Diff

## 2013-08-04 NOTE — ED Notes (Signed)
Grandaughter at bedside talking to pt about having her sign forms to get her medical seen to. Family is also taking pictures of patients feet on arrival to ED.

## 2013-08-04 NOTE — Discharge Instructions (Signed)
Clostridium Difficile Infection Clostridium difficile (C. difficile) is a bacteria found in the intestinal tract or colon. Under certain conditions, it causes diarrhea and sometimes severe disease. The severe form of the disease is known as pseudomembranous colitis (often called C. difficile colitis). This disease can damage the lining of the colon or cause the colon to become enlarged (toxic megacolon).  CAUSES  Your colon normally contains many different bacteria, including C. difficile. The balance of bacteria in your colon can change during illness. This is especially true when you take antibiotic medicine. Taking antibiotics may allow the C. difficile to grow, multiply excessively, and make a toxin that then causes illness. The elderly and people with certain medical conditions have a greater risk of getting C. difficile infections. SYMPTOMS   Watery diarrhea.  Fever.  Fatigue.  Loss of appetite.  Nausea.  Abdominal swelling, pain, or tenderness.  Dehydration. DIAGNOSIS  Your symptoms may make your caregiver suspicious of a C. difficile infection, especially if you have used antibiotics in the preceding weeks. However, there are only 2 ways to know for certain whether you have a C. difficile infection:  A lab test that finds the toxin in your stool.  The specific appearance of an abnormality (pseudomembrane) in your colon. This can only be seen by doing a sigmoidoscopy or colonoscopy. These procedures involve passing an instrument through your rectum to look at the inside of your colon. Your caregiver will help determine if these tests are necessary. TREATMENT   Most people are successfully treated with one of two specific antibiotics, usually given by mouth. Other antibiotics you are receiving are stopped if possible.  Intravenous (IV) fluids and correction of electrolyte imbalance may be necessary.  Rarely, surgery may be needed to remove the infected part of the  intestines.  Careful hand washing by you and your caregivers is important to prevent the spread of infection. In the hospital, your caregivers may also put on gowns and gloves to prevent the spread of the C. difficile bacteria. Your room is also cleaned regularly with a solution containing bleach or a product that is known to kill C. difficile. HOME CARE INSTRUCTIONS  Drink enough fluids to keep your urine clear or pale yellow. Avoid milk, caffeine, and alcohol.  Ask your caregiver for specific rehydration instructions.  Try eating small, frequent meals rather than large meals.  Take your antibiotics as directed. Finish them even if you start to feel better.  Do not use medicines to slow diarrhea. This could delay healing or cause complications.  Wash your hands thoroughly after using the bathroom and before preparing food.  Make sure people who live with you wash their hands often, too.  Carefully disinfect all surfaces with a product that contains chlorine bleach. SEEK MEDICAL CARE IF:  Diarrhea persists longer than expected or recurs after completing your course of antibiotic treatment for the C. difficile infection.  You have trouble staying hydrated. SEEK IMMEDIATE MEDICAL CARE IF:  You develop a new fever.  You have increasing abdominal pain or tenderness.  There is blood in your stools, or your stools are dark black and tarry.  You cannot hold down food or liquids. MAKE SURE YOU:   Understand these instructions.  Will watch your condition.  Will get help right away if you are not doing well or get worse. Document Released: 11/23/2004 Document Revised: 06/10/2012 Document Reviewed: 07/22/2010 ExitCare Patient Information 2014 [employer].  

## 2013-08-04 NOTE — ED Notes (Addendum)
Per EMS, pt from [facility] and has been there since April. Grandaughter requested for patient to come to hospital b/c she felt patient was not being cared for.  Pt recently dx with c-diff and is under treatment. Per EMS, pt does not have any medical condition at this time that is new.  Family has contacted their lawyer and told to have her "evaluated". Pt is alert and oriented to baseline per report. Vitals:  124/68, 97% ra, hr 67, resp 16
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
## 2013-12-20 ENCOUNTER — Emergency Department (HOSPITAL_COMMUNITY): Payer: Medicare PPO

## 2013-12-20 ENCOUNTER — Inpatient Hospital Stay (HOSPITAL_COMMUNITY)
Admission: EM | Admit: 2013-12-20 | Discharge: 2013-12-24 | DRG: 261 | Disposition: A | Discharge status: Skilled Nursing Facility | Payer: Medicare PPO | Attending: Internal Medicine | Admitting: Internal Medicine

## 2013-12-20 ENCOUNTER — Encounter (HOSPITAL_COMMUNITY): Payer: Self-pay | Admitting: *Deleted

## 2013-12-20 DIAGNOSIS — Z8249 Family history of ischemic heart disease and other diseases of the circulatory system: Secondary | ICD-10-CM

## 2013-12-20 DIAGNOSIS — I272 Other secondary pulmonary hypertension: Secondary | ICD-10-CM | POA: Diagnosis present

## 2013-12-20 DIAGNOSIS — R001 Bradycardia, unspecified: Secondary | ICD-10-CM

## 2013-12-20 DIAGNOSIS — Z66 Do not resuscitate: Secondary | ICD-10-CM | POA: Diagnosis present

## 2013-12-20 DIAGNOSIS — E860 Dehydration: Secondary | ICD-10-CM | POA: Diagnosis present

## 2013-12-20 DIAGNOSIS — I481 Persistent atrial fibrillation: Secondary | ICD-10-CM | POA: Diagnosis present

## 2013-12-20 DIAGNOSIS — I441 Atrioventricular block, second degree: Secondary | ICD-10-CM | POA: Diagnosis not present

## 2013-12-20 DIAGNOSIS — I10 Essential (primary) hypertension: Secondary | ICD-10-CM

## 2013-12-20 DIAGNOSIS — I5032 Chronic diastolic (congestive) heart failure: Secondary | ICD-10-CM | POA: Diagnosis present

## 2013-12-20 DIAGNOSIS — M1990 Unspecified osteoarthritis, unspecified site: Secondary | ICD-10-CM | POA: Diagnosis present

## 2013-12-20 DIAGNOSIS — E039 Hypothyroidism, unspecified: Secondary | ICD-10-CM | POA: Diagnosis present

## 2013-12-20 DIAGNOSIS — Z881 Allergy status to other antibiotic agents status: Secondary | ICD-10-CM

## 2013-12-20 DIAGNOSIS — N289 Disorder of kidney and ureter, unspecified: Secondary | ICD-10-CM | POA: Diagnosis present

## 2013-12-20 DIAGNOSIS — E119 Type 2 diabetes mellitus without complications: Secondary | ICD-10-CM

## 2013-12-20 DIAGNOSIS — I4819 Other persistent atrial fibrillation: Secondary | ICD-10-CM

## 2013-12-20 DIAGNOSIS — Z993 Dependence on wheelchair: Secondary | ICD-10-CM

## 2013-12-20 DIAGNOSIS — Z950 Presence of cardiac pacemaker: Secondary | ICD-10-CM

## 2013-12-20 DIAGNOSIS — R531 Weakness: Secondary | ICD-10-CM

## 2013-12-20 DIAGNOSIS — I4891 Unspecified atrial fibrillation: Secondary | ICD-10-CM

## 2013-12-20 HISTORY — DX: Atrioventricular block, second degree: I44.1

## 2013-12-20 LAB — URINE MICROSCOPIC-ADD ON

## 2013-12-20 LAB — BASIC METABOLIC PANEL
ANION GAP: 10 (ref 5–15)
Anion gap: 10 (ref 5–15)
BUN: 47 mg/dL — AB (ref 6–23)
BUN: 45 mg/dL — AB (ref 6–23)
CO2: 27 mEq/L (ref 19–32)
CO2: 26 mEq/L (ref 19–32)
CREATININE: 1.15 mg/dL — AB (ref 0.50–1.10)
Calcium: 9.1 mg/dL (ref 8.4–10.5)
Calcium: 8.8 mg/dL (ref 8.4–10.5)
Chloride: 102 mEq/L (ref 96–112)
Chloride: 105 mEq/L (ref 96–112)
Creatinine, Ser: 1.08 mg/dL (ref 0.50–1.10)
GFR calc Af Amer: 47 mL/min — ABNORMAL LOW (ref >90)
GFR, EST AFRICAN AMERICAN: 50 mL/min — AB (ref >90)
GFR, EST NON AFRICAN AMERICAN: 40 mL/min — AB (ref >90)
GFR, EST NON AFRICAN AMERICAN: 44 mL/min — AB (ref >90)
Glucose, Bld: 78 mg/dL (ref 70–99)
Glucose, Bld: 112 mg/dL — ABNORMAL HIGH (ref 70–99)
Potassium: 4.8 mEq/L (ref 3.7–5.3)
Potassium: 4.8 mEq/L (ref 3.7–5.3)
Sodium: 139 mEq/L (ref 137–147)
Sodium: 141 mEq/L (ref 137–147)

## 2013-12-20 LAB — URINALYSIS, ROUTINE W REFLEX MICROSCOPIC
Bilirubin Urine: NEGATIVE
GLUCOSE, UA: NEGATIVE mg/dL
KETONES UR: NEGATIVE mg/dL
Nitrite: POSITIVE — AB
PROTEIN: NEGATIVE mg/dL
Specific Gravity, Urine: 1.016 (ref 1.005–1.030)
Urobilinogen, UA: 1 mg/dL (ref 0.0–1.0)
pH: 7 (ref 5.0–8.0)

## 2013-12-20 LAB — CBC WITH DIFFERENTIAL/PLATELET
BASOS PCT: 1 % (ref 0–1)
Basophils Absolute: 0 10*3/uL (ref 0.0–0.1)
EOS ABS: 0.2 10*3/uL (ref 0.0–0.7)
Eosinophils Relative: 4 % (ref 0–5)
HCT: 34.7 % — ABNORMAL LOW (ref 36.0–46.0)
Hemoglobin: 11.6 g/dL — ABNORMAL LOW (ref 12.0–15.0)
Lymphocytes Relative: 32 % (ref 12–46)
Lymphs Abs: 1.7 10*3/uL (ref 0.7–4.0)
MCH: 31.1 pg (ref 26.0–34.0)
MCHC: 33.4 g/dL (ref 30.0–36.0)
MCV: 93 fL (ref 78.0–100.0)
MONOS PCT: 12 % (ref 3–12)
Monocytes Absolute: 0.6 10*3/uL (ref 0.1–1.0)
NEUTROS PCT: 51 % (ref 43–77)
Neutro Abs: 2.7 10*3/uL (ref 1.7–7.7)
PLATELETS: 162 10*3/uL (ref 150–400)
RBC: 3.73 MIL/uL — ABNORMAL LOW (ref 3.87–5.11)
RDW: 13.7 % (ref 11.5–15.5)
WBC: 5.2 10*3/uL (ref 4.0–10.5)

## 2013-12-20 LAB — MRSA PCR SCREENING: MRSA BY PCR: NEGATIVE

## 2013-12-20 LAB — PRO B NATRIURETIC PEPTIDE: Pro B Natriuretic peptide (BNP): 3249 pg/mL — ABNORMAL HIGH (ref 0–450)

## 2013-12-20 LAB — CBC
HCT: 31.9 % — ABNORMAL LOW (ref 36.0–46.0)
Hemoglobin: 10.8 g/dL — ABNORMAL LOW (ref 12.0–15.0)
MCH: 30.9 pg (ref 26.0–34.0)
MCHC: 33.9 g/dL (ref 30.0–36.0)
MCV: 91.4 fL (ref 78.0–100.0)
PLATELETS: 155 10*3/uL (ref 150–400)
RBC: 3.49 MIL/uL — AB (ref 3.87–5.11)
RDW: 13.7 % (ref 11.5–15.5)
WBC: 4.7 10*3/uL (ref 4.0–10.5)

## 2013-12-20 LAB — CBG MONITORING, ED: Glucose-Capillary: 65 mg/dL — ABNORMAL LOW (ref 70–99)

## 2013-12-20 LAB — TROPONIN I

## 2013-12-20 LAB — TSH: TSH: 8.11 u[IU]/mL — AB (ref 0.350–4.500)

## 2013-12-20 LAB — GLUCOSE, CAPILLARY
GLUCOSE-CAPILLARY: 162 mg/dL — AB (ref 70–99)
Glucose-Capillary: 160 mg/dL — ABNORMAL HIGH (ref 70–99)
Glucose-Capillary: 190 mg/dL — ABNORMAL HIGH (ref 70–99)

## 2013-12-20 LAB — T4, FREE: FREE T4: 1.16 ng/dL (ref 0.80–1.80)

## 2013-12-20 MED ORDER — ATROPINE SULFATE 0.1 MG/ML IJ SOLN
INTRAMUSCULAR | Status: AC
  Filled 2013-12-20: qty 10

## 2013-12-20 MED ORDER — ONDANSETRON HCL 4 MG/2ML IJ SOLN: 4.0000 mg | Freq: Four times a day (QID) | INTRAMUSCULAR | Status: DC | PRN

## 2013-12-20 MED ORDER — INSULIN ASPART 100 UNIT/ML ~~LOC~~ SOLN: 0.0000 [IU] | Freq: Three times a day (TID) | SUBCUTANEOUS | Status: DC | PRN

## 2013-12-20 MED ORDER — ATROPINE SULFATE 1 MG/ML IJ SOLN
1.0000 mg | Freq: Once | INTRAMUSCULAR | Status: DC
  Filled 2013-12-20: qty 1

## 2013-12-20 MED ORDER — DEXTROSE 50 % IV SOLN
25.0000 mL | Freq: Once | INTRAVENOUS | Status: AC
  Administered 2013-12-20: 25 mL via INTRAVENOUS
  Filled 2013-12-20: qty 50

## 2013-12-20 MED ORDER — INSULIN NPH (HUMAN) (ISOPHANE) 100 UNIT/ML ~~LOC~~ SUSP
8.0000 [IU] | Freq: Every day | SUBCUTANEOUS | Status: DC
  Administered 2013-12-20 – 2013-12-23 (×3): 8 [IU] via SUBCUTANEOUS
  Filled 2013-12-20 (×3): qty 10

## 2013-12-20 MED ORDER — ATROPINE SULFATE 0.1 MG/ML IJ SOLN: 1.0000 mg | Freq: Once | INTRAMUSCULAR | Status: DC

## 2013-12-20 MED ORDER — ACETAMINOPHEN 325 MG PO TABS: 650.0000 mg | ORAL_TABLET | ORAL | Status: DC | PRN

## 2013-12-20 MED ORDER — TRAMADOL HCL 50 MG PO TABS
25.0000 mg | ORAL_TABLET | Freq: Four times a day (QID) | ORAL | Status: DC | PRN
  Filled 2013-12-20: qty 1

## 2013-12-20 MED ORDER — ATROPINE SULFATE 0.1 MG/ML IJ SOLN
1.0000 mg | Freq: Once | INTRAMUSCULAR | Status: AC
  Administered 2013-12-20: 1 mg via INTRAVENOUS

## 2013-12-20 MED ORDER — LEVOTHYROXINE SODIUM 50 MCG PO TABS
50.0000 ug | ORAL_TABLET | Freq: Every day | ORAL | Status: DC
  Administered 2013-12-20 – 2013-12-24 (×5): 50 ug via ORAL
  Filled 2013-12-20 (×6): qty 1

## 2013-12-20 MED ORDER — ASPIRIN EC 81 MG PO TBEC
81.0000 mg | DELAYED_RELEASE_TABLET | Freq: Every day | ORAL | Status: DC
  Administered 2013-12-20 – 2013-12-24 (×5): 81 mg via ORAL
  Filled 2013-12-20 (×5): qty 1

## 2013-12-20 NOTE — ED Provider Notes (Signed)
CSN: [id number]     Arrival date & time 12/20/13  0220 History   First MD Initiated Contact with Patient 12/20/13 0258     Chief Complaint  Patient presents with  . Bradycardia     (Consider location/radiation/quality/duration/timing/severity/associated sxs/prior Treatment) HPI Comments: 80 year old female with history of diabetes, hypothyroidism, dehydration, falls presents with feeling like her heart is going to stop in general weakness since his evening. No history of similar, no known history of slow heart rate per patient. Patient and the nursing home. Patient unsure of any recent medication changes or which medicine she is taking. No chest pain or shortness of breath. Symptoms fairly constant.  The history is provided by the patient.    Past Medical History  Diagnosis Date  . Diabetes mellitus   . Hypertension   . Thyroid disease   . Hypothyroidism   . Blood transfusion 1952    [facility] [place]  . Seasonal allergies     no meds  . Arthritis     shoulder, back  . Clostridium difficile diarrhea    Past Surgical History  Procedure Laterality Date  . Eye surgery      bilateral - cataract surgery  . Svd      x 1  . Cyst excision      uterine    No family history on file. History  Substance Use Topics  . Smoking status: Never Smoker   . Smokeless tobacco: Never Used  . Alcohol Use: No   OB History   Grav Para Term Preterm Abortions TAB SAB Ect Mult Living                 Review of Systems  Constitutional: Positive for fatigue. Negative for fever and chills.  HENT: Negative for congestion.   Eyes: Negative for visual disturbance.  Respiratory: Negative for shortness of breath.   Cardiovascular: Positive for palpitations. Negative for chest pain.  Gastrointestinal: Negative for vomiting and abdominal pain.  Genitourinary: Negative for dysuria and flank pain.  Musculoskeletal: Negative for back pain, neck pain and neck  stiffness.  Skin: Negative for rash.  Neurological: Positive for weakness. Negative for light-headedness and headaches.      Allergies  Penicillins  Home Medications   Prior to Admission medications   Medication Sig Start Date End Date Taking? Authorizing Provider  amLODipine (NORVASC) 5 MG tablet Take 5 mg by mouth daily.   Yes Historical Provider, MD  cholecalciferol (VITAMIN D) 1000 UNITS tablet Take 2,000 Units by mouth daily.   Yes Historical Provider, MD  cyanocobalamin (,VITAMIN B-12,) 1000 MCG/ML injection Inject 1,000 mcg into the muscle every Friday.   Yes Historical Provider, MD  furosemide (LASIX) 20 MG tablet Take 20 mg by mouth every morning.   Yes Historical Provider, MD  insulin aspart (NOVOLOG) 100 UNIT/ML injection Inject 0-15 Units into the skin 3 (three) times daily as needed for high blood sugar (per sliding scale).   Yes Historical Provider, MD  insulin NPH Human (HUMULIN N,NOVOLIN N) 100 UNIT/ML injection Inject 8 Units into the skin at bedtime.   Yes Historical Provider, MD  irbesartan (AVAPRO) 300 MG tablet Take 300 mg by mouth daily.   Yes Historical Provider, MD  levothyroxine (SYNTHROID, LEVOTHROID) 50 MCG tablet Take 50 mcg by mouth daily before breakfast.   Yes Historical Provider, MD  metoprolol succinate (TOPROL-XL) 25 MG 24 hr tablet Take 12.5 mg by mouth daily.   Yes Historical  Provider, MD  Multiple Vitamins-Minerals (THERAGRAN-M PO) Take 1 tablet by mouth daily.   Yes Historical Provider, MD  traMADol (ULTRAM) 50 MG tablet Take 25 mg by mouth every 6 (six) hours as needed.   Yes Historical Provider, MD   BP 153/57  Pulse 40  Temp(Src) 97.9 F (36.6 C) (Oral)  Resp 18  Ht 5\' 6"  (1.676 m)  Wt 153 lb 14.1 oz (69.8 kg)  BMI 24.85 kg/m2  SpO2 98% Physical Exam  Nursing note and vitals reviewed. Constitutional: She is oriented to person, place, and time. She appears well-developed and well-nourished.  HENT:  Head: Normocephalic and atraumatic.   Eyes: Conjunctivae are normal. Right eye exhibits no discharge. Left eye exhibits no discharge.  Neck: Normal range of motion. Neck supple. No tracheal deviation present.  Cardiovascular: An irregularly irregular rhythm present. Bradycardia present.   Pulmonary/Chest: Effort normal and breath sounds normal.  Abdominal: Soft. She exhibits no distension. There is no tenderness. There is no guarding.  Musculoskeletal: She exhibits no edema.  Neurological: She is alert and oriented to person, place, and time.  Skin: Skin is warm. No rash noted.  Psychiatric: She has a normal mood and affect.    ED Course  Procedures (including critical care time) Labs Review Labs Reviewed  BASIC METABOLIC PANEL - Abnormal; Notable for the following:    BUN 47 (*)    Creatinine, Ser 1.15 (*)    GFR calc non Af Amer 40 (*)    GFR calc Af Amer 47 (*)    All other components within normal limits  CBC WITH DIFFERENTIAL - Abnormal; Notable for the following:    RBC 3.73 (*)    Hemoglobin 11.6 (*)    HCT 34.7 (*)    All other components within normal limits  TSH - Abnormal; Notable for the following:    TSH 8.110 (*)    All other components within normal limits  PRO B NATRIURETIC PEPTIDE - Abnormal; Notable for the following:    Pro B Natriuretic peptide (BNP) 3249.0 (*)    All other components within normal limits  BASIC METABOLIC PANEL - Abnormal; Notable for the following:    Glucose, Bld 112 (*)    BUN 45 (*)    GFR calc non Af Amer 44 (*)    GFR calc Af Amer 50 (*)    All other components within normal limits  CBC - Abnormal; Notable for the following:    RBC 3.49 (*)    Hemoglobin 10.8 (*)    HCT 31.9 (*)    All other components within normal limits  CBG MONITORING, ED - Abnormal; Notable for the following:    Glucose-Capillary 65 (*)    All other components within normal limits  MRSA PCR SCREENING  TROPONIN I  URINALYSIS, ROUTINE W REFLEX MICROSCOPIC  T4, FREE    Imaging Review Dg  Chest Portable 1 View  12/20/2013   CLINICAL DATA:  Bradycardia.  EXAM: PORTABLE CHEST - 1 VIEW  COMPARISON:  06/11/2013  FINDINGS: Shallow inspiration. Heart size and pulmonary vascularity are mildly increased, probably normal for technique. No focal airspace disease or consolidation in the lungs. Calcified and tortuous aorta. No pneumothorax. Degenerative changes in the spine and shoulders.  IMPRESSION: No evidence of active pulmonary disease. Heart size and pulmonary vascularity are probably normal for technique.   Electronically Signed   By: Burman Nieves M.D.   On: 12/20/2013 04:00     EKG Interpretation   Date/Time:  Saturday December 20 2013 02:39:59 EDT Ventricular Rate:  153 PR Interval:    QRS Duration: 71 QT Interval:  291 QTC Calculation: 464 R Axis:     Text Interpretation:  Artifact poor baseline bradycardia Confirmed by  Akaya Proffit  MD, Quavion Boule ([id number]) on 12/20/2013 3:01:05 AM     EKG reviewed atrial fibrillation with slow response, bradycardia Rhythm strip reviewed heart rate 49, regular, atrial fibrillation, normal QT MDM   Final diagnoses:  Symptomatic bradycardia  Persistent atrial fibrillation  General weakness   Elderly female without any known cardiac history presents with symptomatic bradycardia. Initial EKG artifact, repeat EKG consistent with bradycardia atrial fibrillation. Atropine given heart rate improved difficult to discern if symptoms improve. Discussed the case with cardiology who will evaluate the patient in the ER and likely admit. Patient is DO NOT RESUSCITATE. Discussed with cardiology who assessed the patient in ER in the brain and to hold blood pressure meds and monitor closely. Patient has very mild symptoms. Discussed and updated the plan with family on the phone. The patients results and plan were reviewed and discussed.   Any x-rays performed were personally reviewed by myself.   Differential diagnosis were considered with the presenting  HPI.  Medications  atropine 0.1 MG/ML injection 1 mg (not administered)  acetaminophen (TYLENOL) tablet 650 mg (not administered)  ondansetron (ZOFRAN) injection 4 mg (not administered)  aspirin EC tablet 81 mg (not administered)  insulin aspart (novoLOG) injection 0-15 Units (not administered)  insulin NPH Human (HUMULIN N,NOVOLIN N) injection 8 Units (not administered)  levothyroxine (SYNTHROID, LEVOTHROID) tablet 50 mcg (not administered)  traMADol (ULTRAM) tablet 25 mg (not administered)  atropine 0.1 MG/ML injection 1 mg (1 mg Intravenous Given 12/20/13 0315)  dextrose 50 % solution 25 mL (25 mLs Intravenous Given 12/20/13 0415)    Filed Vitals:   12/20/13 0400 12/20/13 0430 12/20/13 0500 12/20/13 0635  BP: 115/32 126/49 130/50 153/57  Pulse: 44 45 48 40  Temp:   97.9 F (36.6 C) 97.9 F (36.6 C)  TempSrc:   Oral Oral  Resp: 16 17 15 18   Height:      Weight:   153 lb 14.1 oz (69.8 kg)   SpO2: 98% 100% 98% 98%    Final diagnoses:  Symptomatic bradycardia  Persistent atrial fibrillation  General weakness    Admission/ observation were discussed with the admitting physician, patient and/or family and they are comfortable with the plan.     Enid SkeensJoshua M Brylei Pedley, MD 12/20/13 [phone]

## 2013-12-20 NOTE — ED Notes (Signed)
Per EMS, the patient called because "I just felt funny, like my heart was gonna stop".  On heart monitor, heart rate was between 40-50, irregular.  CBG was 72. BP 126 palpated.  O2 sats 97% on room air.  Patient is from [facility]. 20g in left forearm.

## 2013-12-20 NOTE — ED Notes (Signed)
Cardiology is at the bedside 

## 2013-12-20 NOTE — H&P (Signed)
History and Physical   Patient ID: Kari Ford MRN: [id number], DOB/AGE: 78/11/1920 78 y.o. Date of Encounter: 12/20/2013  Primary Physician: No PCP Per Patient Primary Cardiologist: None  Chief Complaint:  Bradycardia, new onset afib  HPI: Kari PitaCaro Van Patrie is a 80 y.o. female with pMHx significant for DM, HTN, Hypothyroidism presents from her nursing home with bradycardia.  She felt weak this evening and was checking her own pulse and couldn't find it so she calling the nurse who checked it and it was 30-40s and irregular.  [facility].  She denies any cardiac hx.  She has been feeling well recently no illesses etc.  She takes metoprolol for blood pressure control.  She says that her appetite is good and that she has been eating her meals.  She is on lasix she believes for some LE swelling.  Initial labs were un-remarkable accept for some elevation in her creatinine mild elevation in potassium. FSBG was 60.  ECG shows afib with a slow ventricular response.  She has never had any heart rhythm problems before.   Past Medical History  Diagnosis Date  . Diabetes mellitus   . Hypertension   . Thyroid disease   . Hypothyroidism   . Blood transfusion 1952    [facility] [place]  . Seasonal allergies     no meds  . Arthritis     shoulder, back  . Clostridium difficile diarrhea     Surgical History:  Past Surgical History  Procedure Laterality Date  . Eye surgery      bilateral - cataract surgery  . Svd      x 1  . Cyst excision      uterine      I have reviewed the patient's current medications. Prior to Admission medications   Medication Sig Start Date End Date Taking? Authorizing Provider  amLODipine (NORVASC) 5 MG tablet Take 5 mg by mouth daily.   Yes Historical Provider, MD  cholecalciferol (VITAMIN D) 1000 UNITS tablet Take 2,000 Units by mouth daily.   Yes Historical Provider, MD  cyanocobalamin (,VITAMIN  B-12,) 1000 MCG/ML injection Inject 1,000 mcg into the muscle every Friday.   Yes Historical Provider, MD  furosemide (LASIX) 20 MG tablet Take 20 mg by mouth every morning.   Yes Historical Provider, MD  insulin aspart (NOVOLOG) 100 UNIT/ML injection Inject 0-15 Units into the skin 3 (three) times daily as needed for high blood sugar (per sliding scale).   Yes Historical Provider, MD  insulin NPH Human (HUMULIN N,NOVOLIN N) 100 UNIT/ML injection Inject 8 Units into the skin at bedtime.   Yes Historical Provider, MD  irbesartan (AVAPRO) 300 MG tablet Take 300 mg by mouth daily.   Yes Historical Provider, MD  levothyroxine (SYNTHROID, LEVOTHROID) 50 MCG tablet Take 50 mcg by mouth daily before breakfast.   Yes Historical Provider, MD  metoprolol succinate (TOPROL-XL) 25 MG 24 hr tablet Take 12.5 mg by mouth daily.   Yes Historical Provider, MD  Multiple Vitamins-Minerals (THERAGRAN-M PO) Take 1 tablet by mouth daily.   Yes Historical Provider, MD  traMADol (ULTRAM) 50 MG tablet Take 25 mg by mouth every 6 (six) hours as needed.   Yes Historical Provider, MD   Scheduled Meds: . aspirin EC  81 mg Oral Daily  . atropine  1 mg Intravenous Once  . atropine      . dextrose  25 mL Intravenous Once  . insulin NPH Human  8 Units Subcutaneous QHS  . levothyroxine  50 mcg Oral QAC breakfast   Continuous Infusions:  PRN Meds:.acetaminophen, insulin aspart, ondansetron (ZOFRAN) IV, traMADol  Allergies:  Allergies  Allergen Reactions  . Penicillins Hives    History   Social History  . Marital Status: Divorced    Spouse Name: N/A    Number of Children: N/A  . Years of Education: N/A   Occupational History  . Not on file.   Social History Main Topics  . Smoking status: Never Smoker   . Smokeless tobacco: Never Used  . Alcohol Use: No  . Drug Use: No  . Sexual Activity: No   Other Topics Concern  . Not on file   Social History Narrative  . No narrative on file    No family history on  file. No family status information on file.    Review of Systems:   Full 14-point review of systems otherwise negative except as noted above.  Physical Exam: Blood pressure 126/49, pulse 45, temperature 97.8 F (36.6 C), temperature source Oral, resp. rate 17, height 5\' 6"  (1.676 m), weight 142 lb (64.411 kg), SpO2 100.00%. General: Well developed, well nourished,female in no acute distress. Head: Normocephalic, atraumatic, sclera non-icteric, no xanthomas, nares are without discharge. Dentition:  Neck: No carotid bruits. JVD not elevated. No thyromegally Lungs: Good expansion bilaterally. without wheezes or rhonchi.  Heart: IRRegular rate and rhythm bradycardic with S1 S2.  No S3 or S4.  No murmur, no rubs, or gallops appreciated. Abdomen: Soft, non-tender, non-distended with normoactive bowel sounds. No hepatomegaly. No rebound/guarding. No obvious abdominal masses. Extremities: No clubbing or cyanosis. Mild pitting LE edema.  Distal pedal pulses are 2+ in 4 extrem Neuro: Alert and oriented X 3. Moves all extremities spontaneously. No focal deficits noted. Psych:  Responds to questions appropriately with a normal affect. Skin: No rashes or lesions noted  Labs:   Lab Results  Component Value Date   WBC 5.2 12/20/2013   HGB 11.6* 12/20/2013   HCT 34.7* 12/20/2013   MCV 93.0 12/20/2013   PLT 162 12/20/2013   No results found for this basename: INR,  in the last 72 hours  Recent Labs Lab 12/20/13 0236  NA 139  K 4.8  CL 102  CO2 27  BUN 47*  CREATININE 1.15*  CALCIUM 9.1  GLUCOSE 78    Recent Labs  12/20/13 0236  TROPONINI <0.30   No results found for this basename: TROPIPOC,  in the last 72 hours No results found for this basename: CHOL, HDL, LDLCALC, TRIG   Pro B Natriuretic peptide (BNP)  Date/Time Value Ref Range Status  06/11/2013  8:35 AM 842.0* 0 - 450 pg/mL Final   No results found for this basename: DDIMER    Radiology/Studies: Dg Chest Portable 1  View  12/20/2013   CLINICAL DATA:  Bradycardia.  EXAM: PORTABLE CHEST - 1 VIEW  COMPARISON:  06/11/2013  FINDINGS: Shallow inspiration. Heart size and pulmonary vascularity are mildly increased, probably normal for technique. No focal airspace disease or consolidation in the lungs. Calcified and tortuous aorta. No pneumothorax. Degenerative changes in the spine and shoulders.  IMPRESSION: No evidence of active pulmonary disease. Heart size and pulmonary vascularity are probably normal for technique.   Electronically Signed   By: Burman NievesWilliam  Stevens M.D.   On: 12/20/2013 04:00      Echo: 05/2013 unremarkable normal EF and normal wall motion  ECG:  afib with slow ventricular response  ASSESSMENT AND PLAN:  Active Problems:   Bradycardia  1) Bradycardia - ? SSS with new onset Afib.  Will hold all antihypertensive meds and hold her metorpolol.  ED has given IV D50.   Will need to discuss with family if she is a candidate for PPM, she is very frail.   2) Afib - Will hold on any rate controlling medication for her bradycardia to resolve. Not going to start anticoagulation at this point either as I feel she is a high risk for bleeding complication, will use ASA for now CHA2DS2VASc score is 4 and anticoagulation would be recommended but will need further discussion with family.  3) AKI - will liberalize her fluid intake, hold lasix, likely dehydration  Signed, Hosie Spangle, MD 12/20/2013 4:40 AM Beeper [phone]

## 2013-12-20 NOTE — ED Notes (Signed)
[facility], NAD, calm, interactive.

## 2013-12-20 NOTE — Progress Notes (Addendum)
Grand Daughter called with questions on vital signs,lab results and prognosis. She was worried that her grandmother`s heart  Is 'too slow and may stop". Reassured and informed that the Dr will be paged to answer her questions when she comes to the hospital to visit her  grandmother.

## 2013-12-20 NOTE — ED Notes (Signed)
Granddaughter called and asked for an update, was very upset that she was not called now that lock down has been lifted.  Explained that this is just being passed on, and that cardiology is at the bedside.  While obtaining phone number, the granddaughter hung up.

## 2013-12-20 NOTE — ED Notes (Signed)
Spoke to Dr. Jodi MourningZavitz in regards to patient's CBG of 65.  MD advises to give 1/2 amp of dextrose for management.

## 2013-12-20 NOTE — ED Notes (Signed)
[phone] is number given for complaint. Will call relatives and let them know.

## 2013-12-20 NOTE — Progress Notes (Signed)
UR completed 

## 2013-12-20 NOTE — ED Notes (Signed)
ekg given to Dr. Jodi MourningZavitz, patient's family should be updated once lockdown is lifted per their request.

## 2013-12-20 NOTE — ED Notes (Signed)
Discussed with cardiology, patient's response to atropine was only temporary, lasting for about 5 minutes, then heart rate decreased again.  He acknowledges, no new orders.  No second administration at this time.

## 2013-12-20 NOTE — ED Notes (Signed)
Discussed with Dr. Jodi MourningZavitz that patient's heart rate dropped to 40 again, and irregular.  MD acknowledges, atropine at the bedside.

## 2013-12-20 NOTE — Progress Notes (Signed)
Subjective:  Says that she wants to leave the hospital. No complaints of shortness of breath or chest pain. No dizziness. She was admitted last night with bradycardia  Objective:  Vital Signs in the last 24 hours: BP 153/57  Pulse 40  Temp(Src) 97.9 F (36.6 C) (Oral)  Resp 18  Ht 5\' 6"  (1.676 m)  Wt 69.8 kg (153 lb 14.1 oz)  BMI 24.85 kg/m2  SpO2 98%  Physical Exam: Elderly female very hard of hearing who is a poor historian Lungs:  Clear  Cardiac:  Irregular rhythm, normal S1 and S2, no S3 Extremities:  No edema present  Intake/Output from previous day:   Weight Filed Weights   12/20/13 0238 12/20/13 0500  Weight: 64.411 kg (142 lb) 69.8 kg (153 lb 14.1 oz)    Lab Results: Basic Metabolic Panel:  Recent Labs  16/11/9608/24/15 0236 12/20/13 0456  NA 139 141  K 4.8 4.8  CL 102 105  CO2 27 26  GLUCOSE 78 112*  BUN 47* 45*  CREATININE 1.15* 1.08    CBC:  Recent Labs  12/20/13 0236 12/20/13 0456  WBC 5.2 4.7  NEUTROABS 2.7  --   HGB 11.6* 10.8*  HCT 34.7* 31.9*  MCV 93.0 91.4  PLT 162 155    BNP    Component Value Date/Time   PROBNP 3249.0* 12/20/2013 0456    PROTIME: Lab Results  Component Value Date   INR 1.07 04/14/2011    Telemetry: Appears to be in atrial fibrillation with slow response but rate is improved from last night  Assessment/Plan:  1. Atrial fibrillation with slow response currently off of AV nodal blocking agents 2. Confusion 3. Chronic diastolic heart failure   Recommendations:  Her heart rate has improved. History is difficult. Doesn't appear to be a great warfarin anticoagulation candidate will need to follow-up with family. Hold AV nodal blocking agents at the present time. Renal function has improved.      Darden PalmerW. Spencer Tilley, Jr.  MD [facility]  12/20/2013, 10:12 AM

## 2013-12-20 NOTE — ED Notes (Signed)
Portable x-ray at the bedside.  

## 2013-12-20 NOTE — ED Notes (Signed)
Spoke to granddaughter on the phone in regards to visitation. She is complaining that ER is currently locked down, offered her to speak to her grandmother, and she declined. Gave her an update on patient arriving and lab work is being drawn, no other information at this time because she just arrived.  Discussed that someone from staff may escort her to see the patient when available, but she would have to calm down first.  No estimated time how soon she can come back.

## 2013-12-20 NOTE — ED Notes (Signed)
Called [phone] and spoke to Granddaughter, provided phone number for patient experience. She will follow up with them on Monday.

## 2013-12-21 LAB — GLUCOSE, CAPILLARY
GLUCOSE-CAPILLARY: 59 mg/dL — AB (ref 70–99)
GLUCOSE-CAPILLARY: 105 mg/dL — AB (ref 70–99)
Glucose-Capillary: 114 mg/dL — ABNORMAL HIGH (ref 70–99)
Glucose-Capillary: 150 mg/dL — ABNORMAL HIGH (ref 70–99)
Glucose-Capillary: 87 mg/dL (ref 70–99)

## 2013-12-21 NOTE — Progress Notes (Signed)
Subjective:  Patient had a reasonably good night. Still with some episodes of bradycardia overnight and continues in atrial fibrillation. Granddaughter in room today and had a long extensive discussion with her and her grandmother.  Objective:  Vital Signs in the last 24 hours: BP 137/52  Pulse 30  Temp(Src) 98.7 F (37.1 C) (Axillary)  Resp 18  Ht 5\' 6"  (1.676 m)  Wt 70 kg (154 lb 5.2 oz)  BMI 24.92 kg/m2  SpO2 97%  Physical Exam: Elderly female very hard of hearing  Lungs:  Clear  Cardiac:  Irregular rhythm, normal S1 and S2, no S3 Extremities:  No edema present  Intake/Output from previous day:   Weight Filed Weights   12/20/13 0238 12/20/13 0500 12/21/13 0419  Weight: 64.411 kg (142 lb) 69.8 kg (153 lb 14.1 oz) 70 kg (154 lb 5.2 oz)    Lab Results: Basic Metabolic Panel:  Recent Labs  16/11/9608/24/15 0236 12/20/13 0456  NA 139 141  K 4.8 4.8  CL 102 105  CO2 27 26  GLUCOSE 78 112*  BUN 47* 45*  CREATININE 1.15* 1.08    CBC:  Recent Labs  12/20/13 0236 12/20/13 0456  WBC 5.2 4.7  NEUTROABS 2.7  --   HGB 11.6* 10.8*  HCT 34.7* 31.9*  MCV 93.0 91.4  PLT 162 155    BNP    Component Value Date/Time   PROBNP 3249.0* 12/20/2013 0456    PROTIME: Lab Results  Component Value Date   INR 1.07 04/14/2011    Telemetry: Atrial fibrillation with controlled response. Still in the 30s off of rate control agents.  Assessment/Plan:  1. Atrial fibrillation with slow response currently off of AV nodal blocking agents 2.  Chronic diastolic heart failure   Recommendations:  Long discussion with the granddaughter (who interrupted frequently) as well as grandmother who is alert and competent this morning. We discussed atrial fibrillation and anticoagulation. The grandmother stated that she did not wish to take a stronger blood thinner. And would prefer to take aspirin. Also mention the possibility of a Ace maker if her heart rate does not improve. She possibly  could be a candidate for one of the internal pacemakers if it is still available. The grandmother also stated that she did not wish to have a pacemaker at this time but would think about it. Continue to hold AV nodal blocking agents and make further recommendations tomorrow following what her heart rate responses. No systemic anticoagulation at this time.     Darden PalmerW. Spencer Tilley, Jr.  MD [facility] Cardiology  12/21/2013, 9:29 AM

## 2013-12-21 NOTE — Progress Notes (Signed)
Utilization Review Completed.   Aniruddh Ciavarella, RN, BSN Nurse Case Manager  

## 2013-12-22 ENCOUNTER — Encounter (HOSPITAL_COMMUNITY): Payer: Self-pay | Admitting: Physician Assistant

## 2013-12-22 DIAGNOSIS — I481 Persistent atrial fibrillation: Secondary | ICD-10-CM

## 2013-12-22 DIAGNOSIS — I272 Pulmonary hypertension, unspecified: Secondary | ICD-10-CM

## 2013-12-22 DIAGNOSIS — I4891 Unspecified atrial fibrillation: Secondary | ICD-10-CM

## 2013-12-22 DIAGNOSIS — I10 Essential (primary) hypertension: Secondary | ICD-10-CM

## 2013-12-22 DIAGNOSIS — R001 Bradycardia, unspecified: Secondary | ICD-10-CM

## 2013-12-22 LAB — GLUCOSE, CAPILLARY
GLUCOSE-CAPILLARY: 148 mg/dL — AB (ref 70–99)
GLUCOSE-CAPILLARY: 184 mg/dL — AB (ref 70–99)
Glucose-Capillary: 122 mg/dL — ABNORMAL HIGH (ref 70–99)
Glucose-Capillary: 111 mg/dL — ABNORMAL HIGH (ref 70–99)
Glucose-Capillary: 183 mg/dL — ABNORMAL HIGH (ref 70–99)

## 2013-12-22 MED ORDER — HYDRALAZINE HCL 20 MG/ML IJ SOLN
10.0000 mg | INTRAMUSCULAR | Status: DC | PRN
  Administered 2013-12-22: 10 mg via INTRAVENOUS
  Filled 2013-12-22: qty 1

## 2013-12-22 MED ORDER — CETYLPYRIDINIUM CHLORIDE 0.05 % MT LIQD
7.0000 mL | Freq: Two times a day (BID) | OROMUCOSAL | Status: DC
  Administered 2013-12-22 – 2013-12-23 (×2): 7 mL via OROMUCOSAL

## 2013-12-22 MED ORDER — HYDRALAZINE HCL 20 MG/ML IJ SOLN
INTRAMUSCULAR | Status: AC
  Administered 2013-12-22: 10 mg via INTRAVENOUS
  Filled 2013-12-22: qty 1

## 2013-12-22 NOTE — Progress Notes (Signed)
Patient has been in Atrial Fibrillation with a  heart rate in low 20s to 30s.  At 0039 she had a 4 second pause.  Patient asymptomatic.  Blood Pressure 170s/40s.  Dr. Tresa EndoKelly with Cardiology notified and approved patient's transfer to higher level of care.  Patient transferred to 2H26.  Report given to receiving RN.  Attempted to reach the patient's granddaughter but received voicemail.  Left message requesting a return call.    Annabelle Rexroad,RN

## 2013-12-22 NOTE — Progress Notes (Signed)
UR completed 

## 2013-12-22 NOTE — Progress Notes (Signed)
Patient's granddaughter called to update her on patient's status and new room number.  No answer. Voicemail left with new room number.

## 2013-12-22 NOTE — Progress Notes (Signed)
Subjective:  No CP/SOB, just weak  Objective:  Temp:  [97.3 F (36.3 C)-98.5 F (36.9 C)] 97.5 F (36.4 C) (10/26 0751) Pulse Rate:  [35-57] 50 (10/26 0751) Resp:  [13-21] 17 (10/26 0751) BP: (134-214)/(24-96) 168/96 mmHg (10/26 0751) SpO2:  [98 %-100 %] 100 % (10/26 0751) Weight:  [153 lb 14.1 oz (69.8 kg)] 153 lb 14.1 oz (69.8 kg) (10/26 0309) Weight change: -7.1 oz (-0.2 kg)  Intake/Output from previous day: 10/25 0701 - 10/26 0700 In: 50 [P.O.:50] Out: 300 [Urine:300]  Intake/Output from this shift: Total I/O In: 120 [P.O.:120] Out: 150 [Urine:150]  Physical Exam: General appearance: alert and no distress Neck: no adenopathy, no carotid bruit, no JVD, supple, symmetrical, trachea midline and thyroid not enlarged, symmetric, no tenderness/mass/nodules Lungs: clear to auscultation bilaterally Heart: irregularly irregular rhythm Extremities: extremities normal, atraumatic, no cyanosis or edema  Lab Results: Results for orders placed during the hospital encounter of 12/20/13 (from the past 48 hour(s))  GLUCOSE, CAPILLARY     Status: Abnormal   Collection Time    12/20/13 12:28 PM      Result Value Ref Range   Glucose-Capillary 162 (*) 70 - 99 mg/dL  URINALYSIS, ROUTINE W REFLEX MICROSCOPIC     Status: Abnormal   Collection Time    12/20/13  4:08 PM      Result Value Ref Range   Color, Urine YELLOW  YELLOW   APPearance CLOUDY (*) CLEAR   Specific Gravity, Urine 1.016  1.005 - 1.030   pH 7.0  5.0 - 8.0   Glucose, UA NEGATIVE  NEGATIVE mg/dL   Hgb urine dipstick TRACE (*) NEGATIVE   Bilirubin Urine NEGATIVE  NEGATIVE   Ketones, ur NEGATIVE  NEGATIVE mg/dL   Protein, ur NEGATIVE  NEGATIVE mg/dL   Urobilinogen, UA 1.0  0.0 - 1.0 mg/dL   Nitrite POSITIVE (*) NEGATIVE   Leukocytes, UA MODERATE (*) NEGATIVE  URINE MICROSCOPIC-ADD ON     Status: Abnormal   Collection Time    12/20/13  4:08 PM      Result Value Ref Range   Squamous Epithelial / LPF RARE  RARE    WBC, UA 7-10  <3 WBC/hpf   RBC / HPF 0-2  <3 RBC/hpf   Bacteria, UA MANY (*) RARE  GLUCOSE, CAPILLARY     Status: Abnormal   Collection Time    12/20/13  4:11 PM      Result Value Ref Range   Glucose-Capillary 160 (*) 70 - 99 mg/dL  GLUCOSE, CAPILLARY     Status: Abnormal   Collection Time    12/20/13  8:08 PM      Result Value Ref Range   Glucose-Capillary 190 (*) 70 - 99 mg/dL  GLUCOSE, CAPILLARY     Status: Abnormal   Collection Time    12/20/13 11:51 PM      Result Value Ref Range   Glucose-Capillary 150 (*) 70 - 99 mg/dL   Comment 1 Documented in Chart     Comment 2 Notify RN    GLUCOSE, CAPILLARY     Status: Abnormal   Collection Time    12/21/13  4:18 AM      Result Value Ref Range   Glucose-Capillary 59 (*) 70 - 99 mg/dL   Comment 1 Documented in Chart     Comment 2 Notify RN    GLUCOSE, CAPILLARY     Status: Abnormal   Collection Time    12/21/13  5:04 AM  Result Value Ref Range   Glucose-Capillary 105 (*) 70 - 99 mg/dL   Comment 1 Documented in Chart     Comment 2 Notify RN    GLUCOSE, CAPILLARY     Status: Abnormal   Collection Time    12/21/13 11:27 AM      Result Value Ref Range   Glucose-Capillary 114 (*) 70 - 99 mg/dL  GLUCOSE, CAPILLARY     Status: None   Collection Time    12/21/13  5:18 PM      Result Value Ref Range   Glucose-Capillary 87  70 - 99 mg/dL  GLUCOSE, CAPILLARY     Status: Abnormal   Collection Time    12/21/13  9:50 PM      Result Value Ref Range   Glucose-Capillary 122 (*) 70 - 99 mg/dL  GLUCOSE, CAPILLARY     Status: Abnormal   Collection Time    12/22/13  7:55 AM      Result Value Ref Range   Glucose-Capillary 111 (*) 70 - 99 mg/dL   Comment 1 Capillary Sample      Imaging: Imaging results have been reviewed  Assessment/Plan:   1. Active Problems: 2.   Bradycardia 3. CAF  Time Spent Directly with Patient:  20 minutes  Length of Stay:  LOS: 2 days   Pt admitted from SNF with symptomatic bradycardia. HR  in the 40s. On no neg chronotropes. I don't think she is an OAC candidate. Exam benign. Will have Dr. Ladona Ridgelaylor, EP, see and evaluate.  Kari Ford J 12/22/2013, 11:06 AM

## 2013-12-22 NOTE — Consult Note (Signed)
Electrophysiology Consultation Note  Patient ID: Kari Ford, MRN: [id number], DOB/AGE: 04/01/1920 80 y.o. Admit date: 12/20/2013   Date of Consult: 12/22/2013 Primary Physician: No PCP Per Patient Primary Cardiologist: new this admission  Chief Complaint: weakness, low pulse Reason for Consult: atrial fibrillation with bradycardia HR 20s-30s at times  HPI: Kari Ford is a sharp 80 y.o. female with pMHx significant for DM, HTN, hypothyroidism, and no prior cardiac history who presented from a nursing home 12/20/13 with bradycardia and newly recognized atrial fibrillation. She had felt weak the evening of admission. In retrospect she thinks she's felt weak for a few days. She was checking her own pulse and couldn't find it so she calling the nurse who checked it and it was 30-40s and irregular. She was transfered to the [facility] ED. She denied any other recent illnesses or complications lately. She was on metoprolol for HTN. Initial labs were pertinent for mild renal insufficiency with BUN/Cr 47/1.15, K 4.8, Hgb 11.6, troponin negative. She did have hypoglycemia with CBG 65 which was treated. She was given atropine with only temporary response, lasting for about 5 minutes. She was admitted for further evaluation. Metoprolol was held and she was treated with IV fluids. It has been felt by the cardiology team that she is not a great anticoagulation candidate and the patient did not want to take a stronger blood thinner than aspirin. Her renal function improved. Early this morning she had drop in HR to the 20s-30s with a 4 second pause. BP was stable, if not hypertensive. TSH 8.1 but free T4 was 1.16. She is not on any other AVN blocking agents. She has not had an echo this admission, but echo 05/2013 showed moderate LVH, EF 65-70%, mild MR, mildly dilated LA, PA pressure moderately elevated at .  She seems conflicted at the idea of a pacemaker. She says she thinks it's a "waste of time in a 80 year old"  however, is worried about causing her family significant stress if she decides not to get one and has another spell. She says she is ready to go when the Lord wants her, whether she has a pacemaker or not. She denies chest pain or dyspnea at present. She says she just doesn't currently feel strong. She is wheelchair bound at her nursing home but tries to help transfer herself as much as possible "to take a load off the nurses."  Past Medical History  Diagnosis Date  . Diabetes mellitus   . Hypertension   . Thyroid disease   . Hypothyroidism   . Blood transfusion 1952    Bld Transfusion w/C/S  - [facility], [place]  . Seasonal allergies     no meds  . Arthritis     shoulder, back  . Clostridium difficile diarrhea       Most Recent Cardiac Studies: 2D echo 06/12/13 - Left ventricle: The cavity size was normal. There was moderate concentric hypertrophy. Systolic function was vigorous. The estimated ejection fraction was in the range of 65% to 70%. Wall motion was normal; there were no regional wall motion abnormalities. - Aortic valve: Calcified annulus. Trileaflet; normal thickness leaflets. - Mitral valve: Mildly to moderately calcified annulus. Mildly calcified leaflets . Mild regurgitation. - Left atrium: The atrium was mildly dilated. - Pulmonary arteries: Systolic pressure was moderately increased. PA peak pressure: 48mm Hg (S).    Surgical History:  Past Surgical History  Procedure Laterality Date  . Eye surgery  bilateral - cataract surgery  . Svd      x 1  . Cyst excision      uterine      Home Meds: Prior to Admission medications   Medication Sig Start Date End Date Taking? Authorizing Provider  amLODipine (NORVASC) 5 MG tablet Take 5 mg by mouth daily.   Yes Historical Provider, MD  cholecalciferol (VITAMIN D) 1000 UNITS tablet Take 2,000 Units by mouth daily.   Yes Historical Provider, MD  cyanocobalamin (,VITAMIN B-12,) 1000 MCG/ML injection  Inject 1,000 mcg into the muscle every Friday.   Yes Historical Provider, MD  furosemide (LASIX) 20 MG tablet Take 20 mg by mouth every morning.   Yes Historical Provider, MD  insulin aspart (NOVOLOG) 100 UNIT/ML injection Inject 0-15 Units into the skin 3 (three) times daily as needed for high blood sugar (per sliding scale).   Yes Historical Provider, MD  insulin NPH Human (HUMULIN N,NOVOLIN N) 100 UNIT/ML injection Inject 8 Units into the skin at bedtime.   Yes Historical Provider, MD  irbesartan (AVAPRO) 300 MG tablet Take 300 mg by mouth daily.   Yes Historical Provider, MD  levothyroxine (SYNTHROID, LEVOTHROID) 50 MCG tablet Take 50 mcg by mouth daily before breakfast.   Yes Historical Provider, MD  metoprolol succinate (TOPROL-XL) 25 MG 24 hr tablet Take 12.5 mg by mouth daily.   Yes Historical Provider, MD  Multiple Vitamins-Minerals (THERAGRAN-M PO) Take 1 tablet by mouth daily.   Yes Historical Provider, MD  traMADol (ULTRAM) 50 MG tablet Take 25 mg by mouth every 6 (six) hours as needed.   Yes Historical Provider, MD    Inpatient Medications:  . aspirin EC  81 mg Oral Daily  . atropine  1 mg Intravenous Once  . insulin NPH Human  8 Units Subcutaneous QHS  . levothyroxine  50 mcg Oral QAC breakfast      Allergies:  Allergies  Allergen Reactions  . Penicillins Hives    History   Social History  . Marital Status: Divorced    Spouse Name: N/A    Number of Children: N/A  . Years of Education: N/A   Occupational History  . Not on file.   Social History Main Topics  . Smoking status: Never Smoker   . Smokeless tobacco: Never Used  . Alcohol Use: No  . Drug Use: No  . Sexual Activity: No   Other Topics Concern  . Not on file   Social History Narrative  . No narrative on file    Family History  Problem Relation Age of Onset  . Peripheral vascular disease Sister   . Arrhythmia Sister     Review of Systems: All other systems reviewed and are otherwise negative  except as noted above.  Labs:  Recent Labs  12/20/13 0236  TROPONINI <0.30   Lab Results  Component Value Date   WBC 4.7 12/20/2013   HGB 10.8* 12/20/2013   HCT 31.9* 12/20/2013   MCV 91.4 12/20/2013   PLT 155 12/20/2013    Recent Labs Lab 12/20/13 0456  NA 141  K 4.8  CL 105  CO2 26  BUN 45*  CREATININE 1.08  CALCIUM 8.8  GLUCOSE 112*    Radiology/Studies:  Dg Chest Portable 1 View  12/20/2013   CLINICAL DATA:  Bradycardia.  EXAM: PORTABLE CHEST - 1 VIEW  COMPARISON:  06/11/2013  FINDINGS: Shallow inspiration. Heart size and pulmonary vascularity are mildly increased, probably normal for technique. No focal airspace disease or  consolidation in the lungs. Calcified and tortuous aorta. No pneumothorax. Degenerative changes in the spine and shoulders.  IMPRESSION: No evidence of active pulmonary disease. Heart size and pulmonary vascularity are probably normal for technique.   Electronically Signed   By: Burman NievesWilliam  Stevens M.D.   On: 12/20/2013 04:00   EKG: atrial fib with slow ventricular response, no acute ST-T changes - first tracing is slower (bpm not calculated correctly), second tracing was 49bpm  Physical Exam: Blood pressure 175/50, pulse 58, temperature 97.7 F (36.5 C), temperature source Oral, resp. rate 15, height 5\' 6"  (1.676 m), weight 153 lb 14.1 oz (69.8 kg), SpO2 100.00%. General: Well developed, well nourished, in no acute distress. Head: Normocephalic, atraumatic, sclera non-icteric, no xanthomas, nares are without discharge. Poor dentition Neck: Negative for carotid bruits. JVD not elevated. Lungs: Clear bilaterally to auscultation without wheezes, rales, or rhonchi. Breathing is unlabored. Heart: Slow, irregular with S1 S2. No murmurs, rubs, or gallops appreciated. Abdomen: Soft, non-tender, non-distended with normoactive bowel sounds. No hepatomegaly. No rebound/guarding. No obvious abdominal masses. Msk:  Strength and tone appear normal for  age. Extremities: No clubbing or cyanosis. No edema.  Distal pedal pulses are 2+ and equal bilaterally. Neuro: Alert and oriented X 3. No facial asymmetry. No focal deficit. Moves all extremities spontaneously. Psych:  Responds to questions appropriately with a normal affect.   Assessment and Plan:   1. Atrial fibrillation with associated bradycardia 2. Hypothyroidism with elevated TSH but normal free T4 3. Moderate pulm HTN by echo 05/2013 4. HTN 5. Diabetes mellitus with hypoglycemia on admission 6. Mild renal insufficiency with likely dehydration, s/p IVF  I will discuss further with Dr. Ladona Ridgelaylor the idea of pacemaker. The patient says she does not want to make a decision on pacer at this time. Her grandson will be coming by later and she plans to discuss it more with her family. I also discussed code status in depth with patient - although she is on the fence about a pacer, she is very clear that she does not want defibrillation/CPR, coding, or intubation. She would like to be DNR and we will honor those wishes. We could also consider empirically increasing her levothyroxine in case her TFT indices indicate a degree of subclinical hypothyroidism.  Signed, Ronie Spiesayna Dunn PA-C 12/22/2013, 11:36 AM   EP Attending  Patient seen and examined. I have reviewed the findings as noted above by Ronie Spiesayna Dunn, PA-C. She has symptomatic bradycardia and has been found to have atrial fib wiith a slow VR. She has had her metoprolol held and still having episodes of bradycardia. I suspect a PPM is warranted and have recommended this to the patient. She is frail but her mental status is fairly good today as is her memory. She was in [facility] approx 6 months ago. Will plan to schedule tomorrow, Dr. Harlon DittyJA's schedule permiting.  Leonia ReevesGregg Taylor,M.D.

## 2013-12-22 NOTE — Progress Notes (Signed)
BP elevated with SBP repeatedly greater than 200.  Patient denies any discomfort. Manual BP = 200/74.  Dr Tresa EndoKelly paged and made aware. Orders received.

## 2013-12-23 ENCOUNTER — Encounter (HOSPITAL_COMMUNITY)
Admission: EM | Disposition: A | Discharge status: Skilled Nursing Facility | Payer: Self-pay | Source: Home / Self Care | Attending: Internal Medicine

## 2013-12-23 DIAGNOSIS — I481 Persistent atrial fibrillation: Secondary | ICD-10-CM | POA: Diagnosis present

## 2013-12-23 DIAGNOSIS — M1990 Unspecified osteoarthritis, unspecified site: Secondary | ICD-10-CM | POA: Diagnosis present

## 2013-12-23 DIAGNOSIS — Z881 Allergy status to other antibiotic agents status: Secondary | ICD-10-CM | POA: Diagnosis not present

## 2013-12-23 DIAGNOSIS — Z66 Do not resuscitate: Secondary | ICD-10-CM | POA: Diagnosis present

## 2013-12-23 DIAGNOSIS — Z8249 Family history of ischemic heart disease and other diseases of the circulatory system: Secondary | ICD-10-CM | POA: Diagnosis not present

## 2013-12-23 DIAGNOSIS — I272 Other secondary pulmonary hypertension: Secondary | ICD-10-CM | POA: Diagnosis present

## 2013-12-23 DIAGNOSIS — I441 Atrioventricular block, second degree: Secondary | ICD-10-CM | POA: Diagnosis present

## 2013-12-23 DIAGNOSIS — I5032 Chronic diastolic (congestive) heart failure: Secondary | ICD-10-CM | POA: Diagnosis present

## 2013-12-23 DIAGNOSIS — E860 Dehydration: Secondary | ICD-10-CM | POA: Diagnosis present

## 2013-12-23 DIAGNOSIS — Z993 Dependence on wheelchair: Secondary | ICD-10-CM | POA: Diagnosis not present

## 2013-12-23 DIAGNOSIS — E039 Hypothyroidism, unspecified: Secondary | ICD-10-CM | POA: Diagnosis present

## 2013-12-23 DIAGNOSIS — I10 Essential (primary) hypertension: Secondary | ICD-10-CM | POA: Diagnosis present

## 2013-12-23 DIAGNOSIS — N289 Disorder of kidney and ureter, unspecified: Secondary | ICD-10-CM | POA: Diagnosis present

## 2013-12-23 DIAGNOSIS — R001 Bradycardia, unspecified: Secondary | ICD-10-CM | POA: Diagnosis present

## 2013-12-23 HISTORY — PX: PERMANENT PACEMAKER INSERTION: SHX5480

## 2013-12-23 LAB — GLUCOSE, CAPILLARY
GLUCOSE-CAPILLARY: 86 mg/dL (ref 70–99)
GLUCOSE-CAPILLARY: 68 mg/dL — AB (ref 70–99)
Glucose-Capillary: 136 mg/dL — ABNORMAL HIGH (ref 70–99)
Glucose-Capillary: 194 mg/dL — ABNORMAL HIGH (ref 70–99)
Glucose-Capillary: 209 mg/dL — ABNORMAL HIGH (ref 70–99)

## 2013-12-23 SURGERY — PERMANENT PACEMAKER INSERTION
Anesthesia: LOCAL

## 2013-12-23 MED ORDER — SODIUM CHLORIDE 0.9 % IV SOLN: 250.0000 mL | INTRAVENOUS | Status: DC | PRN

## 2013-12-23 MED ORDER — ACETAMINOPHEN 325 MG PO TABS: 325.0000 mg | ORAL_TABLET | ORAL | Status: DC | PRN

## 2013-12-23 MED ORDER — VANCOMYCIN HCL IN DEXTROSE 1-5 GM/200ML-% IV SOLN
1000.0000 mg | INTRAVENOUS | Status: DC
  Filled 2013-12-23: qty 200

## 2013-12-23 MED ORDER — SODIUM CHLORIDE 0.9 % IV SOLN: INTRAVENOUS | Status: DC

## 2013-12-23 MED ORDER — SODIUM CHLORIDE 0.9 % IJ SOLN: 3.0000 mL | INTRAMUSCULAR | Status: DC | PRN

## 2013-12-23 MED ORDER — LIDOCAINE HCL (PF) 1 % IJ SOLN
INTRAMUSCULAR | Status: AC
  Filled 2013-12-23: qty 30

## 2013-12-23 MED ORDER — CHLORHEXIDINE GLUCONATE 4 % EX LIQD
60.0000 mL | Freq: Once | CUTANEOUS | Status: DC
Start: 2013-12-23 — End: 2013-12-23

## 2013-12-23 MED ORDER — HYDROCODONE-ACETAMINOPHEN 5-325 MG PO TABS: 1.0000 | ORAL_TABLET | ORAL | Status: DC | PRN

## 2013-12-23 MED ORDER — DEXTROSE 50 % IV SOLN
INTRAVENOUS | Status: AC
  Filled 2013-12-23: qty 50

## 2013-12-23 MED ORDER — HEPARIN (PORCINE) IN NACL 2-0.9 UNIT/ML-% IJ SOLN
INTRAMUSCULAR | Status: AC
  Filled 2013-12-23: qty 500

## 2013-12-23 MED ORDER — VANCOMYCIN HCL IN DEXTROSE 1-5 GM/200ML-% IV SOLN
1000.0000 mg | Freq: Two times a day (BID) | INTRAVENOUS | Status: AC
  Administered 2013-12-23: 1000 mg via INTRAVENOUS
  Filled 2013-12-23: qty 200

## 2013-12-23 MED ORDER — CHLORHEXIDINE GLUCONATE 4 % EX LIQD: 60.0000 mL | Freq: Once | CUTANEOUS | Status: DC

## 2013-12-23 MED ORDER — ONDANSETRON HCL 4 MG/2ML IJ SOLN: 4.0000 mg | Freq: Four times a day (QID) | INTRAMUSCULAR | Status: DC | PRN

## 2013-12-23 MED ORDER — INSULIN ASPART 100 UNIT/ML ~~LOC~~ SOLN
0.0000 [IU] | Freq: Three times a day (TID) | SUBCUTANEOUS | Status: DC
  Administered 2013-12-24: 1 [IU] via SUBCUTANEOUS

## 2013-12-23 MED ORDER — SODIUM CHLORIDE 0.9 % IJ SOLN
3.0000 mL | Freq: Two times a day (BID) | INTRAMUSCULAR | Status: DC
  Administered 2013-12-24: 3 mL via INTRAVENOUS

## 2013-12-23 MED ORDER — SODIUM CHLORIDE 0.9 % IR SOLN
80.0000 mg | Status: DC
  Filled 2013-12-23: qty 2

## 2013-12-23 NOTE — Care Management Note (Addendum)
    Page 1 of 1   12/24/2013     2:03:51 PM CARE MANAGEMENT NOTE 12/24/2013  Patient:  Kari Ford,Kari Ford   Account Number:  [id number]  Date Initiated:  12/23/2013  Documentation initiated by:  Junius CreamerWELL,DEBBIE  Subjective/Objective Assessment:   adm w bradycardia     Action/Plan:   from nsg facility   Anticipated DC Date:  12/24/2013   Anticipated DC Plan:  SKILLED NURSING FACILITY  In-house referral  Clinical Social Worker         Choice offered to / List presented to:             Status of service:   Medicare Important Message given?  YES (If response is "NO", the following Medicare IM given date fields will be blank) Date Medicare IM given:  12/23/2013 Medicare IM given by:  Junius CreamerWELL,DEBBIE Date Additional Medicare IM given:   Additional Medicare IM given by:    Discharge Disposition:  SKILLED NURSING FACILITY  Per UR Regulation:  Reviewed for med. necessity/level of care/duration of stay  If discussed at Long Length of Stay Meetings, dates discussed:    Comments:  12/24/13 [name], RN, BSN [phone] Pt discharged back to SNF today, per CSW arrangements.

## 2013-12-23 NOTE — Op Note (Signed)
SURGEON:  Hillis RangeJames Devonta Blanford, MD     PREPROCEDURE DIAGNOSIS:  Symptomatic second degree AV block, persistent atrial fibrillation    POSTPROCEDURE DIAGNOSIS:  Symptomatic second degree AV block, persistent atrial fibrillation     PROCEDURES:   1.  Pacemaker implantation.     INTRODUCTION:  Kari Ford is a 80 y.o. female with a history of persistent atrial fibrillation and slow ventricular response with regular RR intervals suggestive of mobitz II second degree AV block who presents today for pacemaker implantation.  The patient reports progressive episodes of dizziness and fatigue over the past few days.  No reversible causes have been identified.  The patient therefore presents today for pacemaker implantation.     DESCRIPTION OF PROCEDURE:  Informed written consent was obtained, and  the patient was brought to the electrophysiology lab in a fasting state.  The patient required no sedation for the procedure today.  The patients left chest was prepped and draped in the usual sterile fashion by the EP lab staff. The skin overlying the left deltopectoral region was infiltrated with lidocaine for local analgesia.  A 4-cm incision was made over the left deltopectoral region.  A left subcutaneous pacemaker pocket was fashioned using a combination of sharp and blunt dissection. Electrocautery was required to assure hemostasis.    RA/RV Lead Placement: The left axillary vein was cannulated. No contrast was required for the procedure today.  Through the left axillary vein, a [employer] Jude [employer] model [id number] (serial number  [id number]) right atrial lead and a [facility] Jude Medical model [id number] (serial number  [id number]) right ventricular lead were advanced with fluoroscopic visualization into the right atrial appendage and right ventricular apex positions respectively.  Initial atrial lead afib- waves measured 1.9 mV with an impedance of 575 ohms.  Right ventricular lead R-waves measured 6 mV with an impedance of 750 ohms  and a threshold of 0.5 V at 0.5 msec.  Both leads were secured to the pectoralis fascia using #2-0 silk over the suture sleeves.   Device Placement:  The leads were then connected to a [facility] [employer] Assurity DR  model [id number] (serial number  [id number]) pacemaker.  The pocket was irrigated with copious gentamicin solution.  The pacemaker was then placed into the pocket.  The pocket was then closed in 2 layers with 2.0 Vicryl suture for the subcutaneous and subcuticular layers.  Steri-  Strips and a sterile dressing were then applied. EBL<6110ml.  There were no early apparent complications.     CONCLUSIONS:   1. Successful implantation of a St Jude Medical Assurity DR  dual-chamber pacemaker for symptomatic second degree AV block and persistent atrial fibrillation  2. No early apparent complications.           Hillis RangeJames Shalynn Jorstad, MD 12/23/2013 11:17 AM

## 2013-12-23 NOTE — H&P (View-Only) (Signed)
    SUBJECTIVE: The patient is doing well today.  At this time, she denies chest pain, shortness of breath, or any new concerns.  CURRENT MEDICATIONS: . antiseptic oral rinse  7 mL Mouth Rinse BID  . aspirin EC  81 mg Oral Daily  . atropine  1 mg Intravenous Once  . insulin NPH Human  8 Units Subcutaneous QHS  . levothyroxine  50 mcg Oral QAC breakfast      OBJECTIVE: Physical Exam: Filed Vitals:   12/23/13 0300 12/23/13 0400 12/23/13 0500 12/23/13 0744  BP: 154/33 150/39 134/30 152/47  Pulse: 52 39 55 51  Temp: 97.5 F (36.4 C)   97.7 F (36.5 C)  TempSrc: Oral   Oral  Resp: 19 24 15 22  Height:      Weight: 151 lb 0.2 oz (68.5 kg)     SpO2: 100% 99% 99% 99%    Intake/Output Summary (Last 24 hours) at 12/23/13 0754 Last data filed at 12/23/13 0300  Gross per 24 hour  Intake    480 ml  Output    650 ml  Net   -170 ml    Telemetry reveals atrial fibrillation with slow ventricular response, v rates 30-50's  GEN- The patient is elderly appearing, alert and oriented x 3 today.  Pleasant, articulate, and bright Head- normocephalic, atraumatic Eyes-  Sclera clear, conjunctiva pink Ears- hearing intact Oropharynx- clear Neck- supple, no JVP Lungs- Clear to ausculation bilaterally, normal work of breathing Heart- bradycardic irregular rhythm GI- soft, NT, ND, + BS Extremities- no clubbing, cyanosis, or edema Skin- no rash or lesion Psych- euthymic mood, full affect Neuro- strength and sensation are intact  RADIOLOGY: Dg Chest Portable 1 View 12/20/2013   CLINICAL DATA:  Bradycardia.  EXAM: PORTABLE CHEST - 1 VIEW  COMPARISON:  06/11/2013  FINDINGS: Shallow inspiration. Heart size and pulmonary vascularity are mildly increased, probably normal for technique. No focal airspace disease or consolidation in the lungs. Calcified and tortuous aorta. No pneumothorax. Degenerative changes in the spine and shoulders.  IMPRESSION: No evidence of active pulmonary disease. Heart  size and pulmonary vascularity are probably normal for technique.   Electronically Signed   By: William  Stevens M.D.   On: 12/20/2013 04:00    ASSESSMENT AND PLAN:  Active Problems:   Diabetes mellitus, type 2   Hypothyroidism   Bradycardia   Atrial fibrillation   Pulmonary HTN   HTN (hypertension)  1. Mobitz II second degree AV block The patient has symptomatic bradycardia with fairly regular RR intervals suggestive of advanced conduction system disease.  No reversible causes are found.  I would therefore recommend pacemaker implantation at this time.  Risks, benefits, alternatives to pacemaker implantation were discussed in detail with the patient today. The patient understands that the risks include but are not limited to bleeding, infection, pneumothorax, perforation, tamponade, vascular damage, renal failure, MI, stroke, death,  and lead dislodgement and wishes to proceed at this time.  2. Persistent afib Not presently on anticoagulation despite chads2vasc score of at least 5.   This patients CHA2DS2-VASc Score and unadjusted Ischemic Stroke Rate (% per year) is equal to 7.2 % stroke rate/year from a score of 5  Above score calculated as 1 point each if present [CHF, HTN, DM, Vascular=MI/PAD/Aortic Plaque, Age if 65-74, or Female] Above score calculated as 2 points each if present [Age > 75, or Stroke/TIA/TE]  She has previously declined anticoagulation. This can be further addressed as an outpatient.  

## 2013-12-23 NOTE — Interval H&P Note (Signed)
History and Physical Interval Note:  12/23/2013 9:45 AM  Kari Ford  has presented today for surgery, with the diagnosis of bradycardia  The various methods of treatment have been discussed with the patient and family. After consideration of risks, benefits and other options for treatment, the patient has consented to  Procedure(s): PERMANENT PACEMAKER INSERTION (N/A) as a surgical intervention .  The patient's history has been reviewed, patient examined, no change in status, stable for surgery.  I have reviewed the patient's chart and labs.  Questions were answered to the patient's satisfaction.     Hillis RangeAllred, Mariangel Ringley

## 2013-12-23 NOTE — Progress Notes (Signed)
    SUBJECTIVE: The patient is doing well today.  At this time, she denies chest pain, shortness of breath, or any new concerns.  CURRENT MEDICATIONS: . antiseptic oral rinse  7 mL Mouth Rinse BID  . aspirin EC  81 mg Oral Daily  . atropine  1 mg Intravenous Once  . insulin NPH Human  8 Units Subcutaneous QHS  . levothyroxine  50 mcg Oral QAC breakfast      OBJECTIVE: Physical Exam: Filed Vitals:   12/23/13 0300 12/23/13 0400 12/23/13 0500 12/23/13 0744  BP: 154/33 150/39 134/30 152/47  Pulse: 52 39 55 51  Temp: 97.5 F (36.4 C)   97.7 F (36.5 C)  TempSrc: Oral   Oral  Resp: 19 24 15 22   Height:      Weight: 151 lb 0.2 oz (68.5 kg)     SpO2: 100% 99% 99% 99%    Intake/Output Summary (Last 24 hours) at 12/23/13 0754 Last data filed at 12/23/13 0300  Gross per 24 hour  Intake    480 ml  Output    650 ml  Net   -170 ml    Telemetry reveals atrial fibrillation with slow ventricular response, v rates 30-50's  GEN- The patient is elderly appearing, alert and oriented x 3 today.  Pleasant, articulate, and bright Head- normocephalic, atraumatic Eyes-  Sclera clear, conjunctiva pink Ears- hearing intact Oropharynx- clear Neck- supple, no JVP Lungs- Clear to ausculation bilaterally, normal work of breathing Heart- bradycardic irregular rhythm GI- soft, NT, ND, + BS Extremities- no clubbing, cyanosis, or edema Skin- no rash or lesion Psych- euthymic mood, full affect Neuro- strength and sensation are intact  RADIOLOGY: Dg Chest Portable 1 View 12/20/2013   CLINICAL DATA:  Bradycardia.  EXAM: PORTABLE CHEST - 1 VIEW  COMPARISON:  06/11/2013  FINDINGS: Shallow inspiration. Heart size and pulmonary vascularity are mildly increased, probably normal for technique. No focal airspace disease or consolidation in the lungs. Calcified and tortuous aorta. No pneumothorax. Degenerative changes in the spine and shoulders.  IMPRESSION: No evidence of active pulmonary disease. Heart  size and pulmonary vascularity are probably normal for technique.   Electronically Signed   By: Burman NievesWilliam  Stevens M.D.   On: 12/20/2013 04:00    ASSESSMENT AND PLAN:  Active Problems:   Diabetes mellitus, type 2   Hypothyroidism   Bradycardia   Atrial fibrillation   Pulmonary HTN   HTN (hypertension)  1. Mobitz II second degree AV block The patient has symptomatic bradycardia with fairly regular RR intervals suggestive of advanced conduction system disease.  No reversible causes are found.  I would therefore recommend pacemaker implantation at this time.  Risks, benefits, alternatives to pacemaker implantation were discussed in detail with the patient today. The patient understands that the risks include but are not limited to bleeding, infection, pneumothorax, perforation, tamponade, vascular damage, renal failure, MI, stroke, death,  and lead dislodgement and wishes to proceed at this time.  2. Persistent afib Not presently on anticoagulation despite chads2vasc score of at least 5.   This patients CHA2DS2-VASc Score and unadjusted Ischemic Stroke Rate (% per year) is equal to 7.2 % stroke rate/year from a score of 5  Above score calculated as 1 point each if present [CHF, HTN, DM, Vascular=MI/PAD/Aortic Plaque, Age if 65-74, or Female] Above score calculated as 2 points each if present [Age > 75, or Stroke/TIA/TE]  She has previously declined anticoagulation. This can be further addressed as an outpatient.

## 2013-12-23 NOTE — Progress Notes (Signed)
Inpatient Diabetes Program Recommendations  AACE/ADA: New Consensus Statement on Inpatient Glycemic Control (2013)  Target Ranges:  Prepandial:   less than 140 mg/dL      Peak postprandial:   less than 180 mg/dL (1-2 hours)      Critically ill patients:  140 - 180 mg/dL  Results for Tyler PitaKISER, Dilyn VAN (MRN [id number]) as of 12/23/2013 10:33  Ref. Range 12/23/2013 07:48 12/23/2013 10:07  Glucose-Capillary Latest Range: 70-99 mg/dL 86 68 (L)   Consider 1/2 NPH dose if will be NPO and/or use Novolog sensitive scale per Glycemic Control Order-set. Thank you  Piedad ClimesGina Kyrah Schiro BSN, RN,CDE Inpatient Diabetes Coordinator [phone] (team pager)

## 2013-12-23 NOTE — Progress Notes (Signed)
Called Amber EP-RN to let her know pt was upset and wanted to go home.  She was told that she lost her room at [facility].  She was upset that no one would be there to take care of her room mate. Family Francis Dowse(Joel) called and explained situation. Francis DowseJoel said she had not lost her room and that family was on the way to see her. Pt was fixating on loss of room at facility and wanted to leave. Dr Johney FrameAllred came and spoke with pt and she was agreeable to discharging in the morning. Pt resting with call bell within reach.  Will continue to monitor. Pt resting with call bell within reach.  Will continue to monitor. Thomas HoffBurton, [place] Mallon McClintock, RN

## 2013-12-23 NOTE — Progress Notes (Signed)
Hypoglycemic Event  CBG: 68   Treatment: D50 IV 25 mL given at 1021.  Symptoms: None  Follow-up CBG: Time: 1038 CBG Result: 136  Possible Reasons for Event: Inadequate meal intake  Comments/MD notified:Dr Allred   NPO for PPM Implant today.     Flossie BuffyJohnson, Mozel Burdett Jeanette  Remember to initiate Hypoglycemia Order Set & complete

## 2013-12-24 ENCOUNTER — Encounter (HOSPITAL_COMMUNITY): Payer: Self-pay | Admitting: Physician Assistant

## 2013-12-24 ENCOUNTER — Inpatient Hospital Stay (HOSPITAL_COMMUNITY): Payer: Medicare PPO

## 2013-12-24 HISTORY — PX: PACEMAKER INSERTION: SHX728

## 2013-12-24 LAB — GLUCOSE, CAPILLARY
GLUCOSE-CAPILLARY: 90 mg/dL (ref 70–99)
Glucose-Capillary: 126 mg/dL — ABNORMAL HIGH (ref 70–99)

## 2013-12-24 LAB — BASIC METABOLIC PANEL
Anion gap: 8 (ref 5–15)
BUN: 21 mg/dL (ref 6–23)
CALCIUM: 8.8 mg/dL (ref 8.4–10.5)
CHLORIDE: 104 meq/L (ref 96–112)
CO2: 27 meq/L (ref 19–32)
CREATININE: 0.69 mg/dL (ref 0.50–1.10)
GFR calc Af Amer: 86 mL/min — ABNORMAL LOW (ref >90)
GFR calc non Af Amer: 74 mL/min — ABNORMAL LOW (ref >90)
Glucose, Bld: 110 mg/dL — ABNORMAL HIGH (ref 70–99)
Potassium: 4.3 mEq/L (ref 3.7–5.3)
Sodium: 139 mEq/L (ref 137–147)

## 2013-12-24 MED ORDER — ASPIRIN 81 MG PO TBEC: 81.0000 mg | DELAYED_RELEASE_TABLET | Freq: Every day | ORAL | Status: DC

## 2013-12-24 NOTE — Clinical Social Work Note (Signed)
Patient to be d/c'ed today back to [facility].  Patient and family agreeable to plans will transport via ems RN to call report.  Patient expressed she was excited and grateful to be returning back to [facility].  Windell MouldingEric Henley Blyth, MSW, Theresia MajorsLCSWA [phone]

## 2013-12-24 NOTE — Discharge Summary (Signed)
Physician Discharge Summary    Cardiologist:   Patient ID: Kari Ford MRN: [id number] DOB/AGE: [birth year] 80 y.o.  Admit date: 12/20/2013 Discharge date: 12/24/2013  Admission Diagnoses:  Second degree AVB,  Pacemaker implant  Discharge Diagnoses:  Active Problems:   Diabetes mellitus, type 2   Hypothyroidism   Bradycardia   Atrial fibrillation   Pulmonary HTN   HTN (hypertension)   Second degree AV block   Discharged Condition: stable  Hospital Course:   Ms. Neuner is a sharp 80 y.o. female with pMHx significant for DM, HTN, hypothyroidism, and no prior cardiac history who presented from a nursing home 12/20/13 with bradycardia and newly recognized atrial fibrillation. She had felt weak the evening of admission. In retrospect she thinks she's felt weak for a few days. She was checking her own pulse and couldn't find it so she calling the nurse who checked it and it was 30-40s and irregular. She was transfered to [facility] ED. She denied any other recent illnesses or complications lately. She was on metoprolol for HTN. Initial labs were pertinent for mild renal insufficiency with BUN/Cr 47/1.15, K 4.8, Hgb 11.6, troponin negative. She did have hypoglycemia with CBG 65 which was treated. She was given atropine with only temporary response, lasting for about 5 minutes. She was admitted for further evaluation. Metoprolol was held and she was treated with IV fluids. It has been felt by the cardiology team that she is not a great anticoagulation candidate and the patient did not want to take a stronger blood thinner than aspirin. Her renal function improved. Early this morning she had drop in HR to the 20s-30s with a 4 second pause. BP was stable, if not hypertensive. TSH 8.1 but free T4 was 1.16. She is not on any other AVN blocking agents. She has not had an echo this admission, but echo 05/2013 showed moderate LVH, EF 65-70%, mild MR, mildly dilated LA, PA pressure moderately elevated at  .   She seems conflicted at the idea of a pacemaker. She says she thinks it's a "waste of time in a 80 year old" however, is worried about causing her family significant stress if she decides not to get one and has another spell. She says she is ready to go when the Lord wants her, whether she has a pacemaker or not. She denies chest pain or dyspnea at present. She says she just doesn't currently feel strong. She is wheelchair bound at her nursing home but tries to help transfer herself as much as possible "to take a load off the nurses."  She underwent successful implantation of a [address] Medical Assurity DR model [id number] (serial number [id number]) pacemaker.  CXR revealed no pneumothorax, left effusion and LLL consolidation. Stable.  The patient has declined anticoagulation before.  This will be readdressed in the office. The patient was seen by Dr. Johney Frame who felt she was stable for DC home.  Continue home tramadol for PPM site pain.     Consults: Diabetes Coor.  Significant Diagnostic Studies:  SURGEON: Hillis Range, MD  PREPROCEDURE DIAGNOSIS: Symptomatic second degree AV block, persistent atrial fibrillation  POSTPROCEDURE DIAGNOSIS: Symptomatic second degree AV block, persistent atrial fibrillation  PROCEDURES:  1. Pacemaker implantation.  INTRODUCTION: Aleaya Latona is a 80 y.o. female with a history of persistent atrial fibrillation and slow ventricular response with regular RR intervals suggestive of mobitz II second degree AV block who presents today for pacemaker implantation. The patient reports progressive episodes of dizziness  and fatigue over the past few days. No reversible causes have been identified. The patient therefore presents today for pacemaker implantation.  DESCRIPTION OF PROCEDURE: Informed written consent was obtained, and the patient was brought to the electrophysiology lab in a fasting state. The patient required no sedation for the procedure today. The patients left  chest was prepped and draped in the usual sterile fashion by the EP lab staff. The skin overlying the left deltopectoral region was infiltrated with lidocaine for local analgesia. A 4-cm incision was made over the left deltopectoral region. A left subcutaneous pacemaker pocket was fashioned using a combination of sharp and blunt dissection. Electrocautery was required to assure hemostasis.  RA/RV Lead Placement:  The left axillary vein was cannulated. No contrast was required for the procedure today. Through the left axillary vein, a [employer] Jude Medical model [id number] (serial number [id number]) right atrial lead and a [facility] [employer] Medical model [id number] (serial number [id number]) right ventricular lead were advanced with fluoroscopic visualization into the right atrial appendage and right ventricular apex positions respectively. Initial atrial lead afib- waves measured 1.9 mV with an impedance of 575 ohms. Right ventricular lead R-waves measured 6 mV with an impedance of 750 ohms and a threshold of 0.5 V at 0.5 msec. Both leads were secured to the pectoralis fascia using #2-0 silk over the suture sleeves.  Device Placement:  The leads were then connected to a [facility] Jude Medical Assurity DR model [id number] (serial number [id number]) pacemaker. The pocket was irrigated with copious gentamicin solution. The pacemaker was then placed into the pocket. The pocket was then closed in 2 layers with 2.0 Vicryl suture for the subcutaneous and subcuticular layers. Steri- Strips and a sterile dressing were then applied. EBL<7210ml. There were no early apparent complications.  CONCLUSIONS:  1. Successful implantation of a St Jude Medical Assurity DR dual-chamber pacemaker for symptomatic second degree AV block and persistent atrial fibrillation  2. No early apparent complications.  Hillis RangeJames Joceline Hinchcliff, MD  Treatments: See above  Discharge Exam: Blood pressure 158/73, pulse 64, temperature 98.7 F (37.1 C), temperature source Oral, resp. rate 17,  height 5\' 6"  (1.676 m), weight 151 lb 0.2 oz (68.5 kg), SpO2 100.00%.   Disposition: 01-Home or Self Care      Discharge Instructions   Call MD for:  redness, tenderness, or signs of infection (pain, swelling, redness, odor or green/yellow discharge around incision site)    Complete by:  As directed      Diet - low sodium heart healthy    Complete by:  As directed      Increase activity slowly    Complete by:  As directed             Medication List    STOP taking these medications       metoprolol succinate 25 MG 24 hr tablet  Commonly known as:  TOPROL-XL      TAKE these medications       amLODipine 5 MG tablet  Commonly known as:  NORVASC  Take 5 mg by mouth daily.     aspirin 81 MG EC tablet  Take 1 tablet (81 mg total) by mouth daily.     cholecalciferol 1000 UNITS tablet  Commonly known as:  VITAMIN D  Take 2,000 Units by mouth daily.     cyanocobalamin 1000 MCG/ML injection  Commonly known as:  (VITAMIN B-12)  Inject 1,000 mcg into the muscle every Friday.     furosemide 20 MG tablet  Commonly known as:  LASIX  Take 20 mg by mouth every morning.     insulin aspart 100 UNIT/ML injection  Commonly known as:  novoLOG  Inject 0-15 Units into the skin 3 (three) times daily as needed for high blood sugar (per sliding scale).     insulin NPH Human 100 UNIT/ML injection  Commonly known as:  HUMULIN N,NOVOLIN N  Inject 8 Units into the skin at bedtime.     irbesartan 300 MG tablet  Commonly known as:  AVAPRO  Take 300 mg by mouth daily.     levothyroxine 50 MCG tablet  Commonly known as:  SYNTHROID, LEVOTHROID  Take 50 mcg by mouth daily before breakfast.     THERAGRAN-M PO  Take 1 tablet by mouth daily.     traMADol 50 MG tablet  Commonly known as:  ULTRAM  Take 25 mg by mouth every 6 (six) hours as needed.       Follow-up Information   Follow up with Wound Check On 01/01/2014. (3:30 PM)    Contact information:   [address] [place] [address] [phone]      Follow up with Hillis RangeAllred, Letti Towell, MD On 03/06/2014. (2:00 PM)    Specialty:  Cardiology   Contact information:   [address] [place] [address] [phone]      Greater than 30 minutes was spent completing the patient's discharge.    SignedWilburt Finlay: HAGER, BRYAN, PAC 12/24/2013, 8:28 AM  Hillis RangeJames Shonna Deiter MD

## 2013-12-24 NOTE — Progress Notes (Signed)
   SUBJECTIVE: The patient is doing well today.  At this time, she denies chest pain, shortness of breath, or any new concerns.  Marland Kitchen. aspirin EC  81 mg Oral Daily  . insulin aspart  0-9 Units Subcutaneous TID WC  . insulin NPH Human  8 Units Subcutaneous QHS  . levothyroxine  50 mcg Oral QAC breakfast  . sodium chloride  3 mL Intravenous Q12H      OBJECTIVE: Physical Exam: Filed Vitals:   12/23/13 1419 12/23/13 1600 12/23/13 2030 12/24/13 0436  BP: 147/61 117/47 167/48 158/73  Pulse:  65 64 64  Temp:   97.9 F (36.6 C) 98.7 F (37.1 C)  TempSrc:   Oral Oral  Resp:   18 17  Height:      Weight:      SpO2:   99% 100%    Intake/Output Summary (Last 24 hours) at 12/24/13 [id number] Last data filed at 12/24/13 [id number]  Gross per 24 hour  Intake    240 ml  Output    350 ml  Net   -110 ml    Telemetry reveals afib with V pacing  GEN- The patient is elderly appearing, alert and oriented x 3 today.   Head- normocephalic, atraumatic Eyes-  Sclera clear, conjunctiva pink Ears- hearing intact Oropharynx- clear Neck- supple,  Lungs- Clear to ausculation bilaterally, normal work of breathing Heart- Regular rate and rhythm (paced) GI- soft, NT, ND, + BS Extremities- no clubbing, cyanosis, or edema Skin- pacemaker pocket is without hematoma Psych- euthymic mood, full affect Neuro- strength and sensation are intact  LABS: Basic Metabolic Panel:  Recent Labs  54/10/8108/28/15 0500  NA 139  K 4.3  CL 104  CO2 27  GLUCOSE 110*  BUN 21  CREATININE 0.69  CALCIUM 8.8   RADIOLOGY: Dg Chest Portable 1 View  12/20/2013   CLINICAL DATA:  Bradycardia.  EXAM: PORTABLE CHEST - 1 VIEW  COMPARISON:  06/11/2013  FINDINGS: Shallow inspiration. Heart size and pulmonary vascularity are mildly increased, probably normal for technique. No focal airspace disease or consolidation in the lungs. Calcified and tortuous aorta. No pneumothorax. Degenerative changes in the spine and shoulders.  IMPRESSION: No  evidence of active pulmonary disease. Heart size and pulmonary vascularity are probably normal for technique.   Electronically Signed   By: Burman NievesWilliam  Stevens M.D.   On: 12/20/2013 04:00    ASSESSMENT AND PLAN:  Active Problems:   Diabetes mellitus, type 2   Hypothyroidism   Bradycardia   Atrial fibrillation   Pulmonary HTN   HTN (hypertension)   Second degree AV block  1. Second degree AV block Doing well s/p PPM cxr is reviewed and reveals stable leads Device interrogation is reviewed and normal  2. afib She has previously declined anticoagulation This can be further discussed as an outpatient  3. htn Stable No change required today  DC to [facility] Routine wound care and follow-up  Hillis RangeAllred, Donnovan Stamour, MD 12/24/2013 7:02 AM

## 2013-12-24 NOTE — Clinical Social Work Psychosocial (Signed)
Clinical Social Work Department BRIEF PSYCHOSOCIAL ASSESSMENT 12/24/2013  Patient:  Tyler PitaKISER,Deshante VAN     Account Number:  [id number]     Admit date:  12/20/2013  Clinical Social Worker:  Elouise MunroeANTERHAUS,Kamyla Olejnik, LCSWA  Date/Time:  12/24/2013 02:54 PM  Referred by:  Physician  Date Referred:  12/24/2013 Referred for  SNF Placement   Other Referral:   Interview type:  Patient Other interview type:    PSYCHOSOCIAL DATA Living Status:  FACILITY [facility] Level of care:  Skilled Nursing Facility Primary support name:  Dewain PenningJoel Bush Primary support relationship to patient:  FAMILY Degree of support available:   Patient's grandson is the primary contact for patient.    CURRENT CONCERNS  Other Concerns:    SOCIAL WORK ASSESSMENT / PLAN Paitient is a 80 year old female who is alert and oriented x4.  Patient was pleasant to talk to and expressed how she would like to go back to [facility] which is the facility that she has been living long term.  Patient stated she was excited that she would be going back and the facility keeps her busy which she says she enjoys.   Assessment/plan status:   Other assessment/ plan:   Information/referral to community resources:    PATIENT'S/FAMILY'S RESPONSE TO PLAN OF CARE: Patient and grandson in agreement with plan to discharge back to facility.   Ervin KnackEric R. Linden Tagliaferro, MSW, LCSWA [phone] 12/24/2013 3:00 PM

## 2013-12-24 NOTE — Discharge Instructions (Signed)
° ° °  Supplemental Discharge Instructions for  Pacemaker/Defibrillator Patients  Activity No heavy lifting or vigorous activity with your left/right arm for 6 to 8 weeks.  Do not raise your left/right arm above your head for one week.  Gradually raise your affected arm as drawn below progressing every two days..           __  IF YOU DRIVE, NO DRIVING for one week    ; you may begin driving on  November 4.   .  WOUND CARE   Keep the wound area clean and dry.  Do not get this area wet for one week. No showers for one week; you may shower on  November 4,     .  No soaking in a bath tub or swimming pools for 8 weeks.   The tape/steri-strips on your wound will fall off; do not pull them off.  No bandage is needed on the site.  DO  NOT apply any creams, oils, or ointments to the wound area.   If you notice any drainage or discharge from the wound, any swelling or bruising at the site, or you develop a fever > 101? F after you are discharged home, call the office at once.  Special Instructions   You are still able to use cellular telephones; use the ear opposite the side where you have your pacemaker/defibrillator.  Avoid carrying your cellular phone near your device.   When traveling through airports, show security personnel your identification card to avoid being screened in the metal detectors.  Ask the security personnel to use the hand wand.   Avoid arc welding equipment, MRI testing (magnetic resonance imaging), TENS units (transcutaneous nerve stimulators).  Call the office for questions about other devices.   Avoid electrical appliances that are in poor condition or are not properly grounded.   Microwave ovens are safe to be near or to operate.  Additional information for defibrillator patients should your device go off:   If your device goes off ONCE and you feel fine afterward, notify the device clinic nurses.   If your device goes off ONCE and you do not feel well afterward, call 911.   If your device goes off TWICE, call 911.   If your device goes off THREE times in one day, call 911.  DO NOT DRIVE YOURSELF OR A FAMILY MEMBER WITH A DEFIBRILLATOR TO THE HOSPITAL--CALL 911.

## 2013-12-24 NOTE — Progress Notes (Signed)
Discharge education completed by RN. IV removed, site is within normal limits. Pt will discharge from the unit via PTAR.

## 2014-01-01 ENCOUNTER — Ambulatory Visit (INDEPENDENT_AMBULATORY_CARE_PROVIDER_SITE_OTHER): Payer: Medicare PPO | Admitting: *Deleted

## 2014-01-01 DIAGNOSIS — R001 Bradycardia, unspecified: Secondary | ICD-10-CM

## 2014-01-01 DIAGNOSIS — I481 Persistent atrial fibrillation: Secondary | ICD-10-CM

## 2014-01-01 DIAGNOSIS — I4819 Other persistent atrial fibrillation: Secondary | ICD-10-CM

## 2014-01-01 DIAGNOSIS — I441 Atrioventricular block, second degree: Secondary | ICD-10-CM

## 2014-01-01 LAB — MDC_IDC_ENUM_SESS_TYPE_INCLINIC
Battery Remaining Longevity: 147.6 mo
Battery Voltage: 3.01 V
Implantable Pulse Generator Model: 2240
Implantable Pulse Generator Serial Number: 7677103
Lead Channel Impedance Value: 587.5 Ohm
Lead Channel Pacing Threshold Amplitude: 0.625 V
Lead Channel Pacing Threshold Pulse Width: 0.4 ms
Lead Channel Sensing Intrinsic Amplitude: 6.8 mV
Lead Channel Setting Pacing Amplitude: 3.5 V
Lead Channel Setting Pacing Pulse Width: 0.4 ms
Lead Channel Setting Sensing Sensitivity: 2 mV
MDC IDC MSMT LEADCHNL RA SENSING INTR AMPL: 1.5 mV
MDC IDC MSMT LEADCHNL RV IMPEDANCE VALUE: 775 Ohm
MDC IDC SESS DTM: 20151105164334
MDC IDC SET LEADCHNL RV PACING AMPLITUDE: 0.875
MDC IDC STAT BRADY RA PERCENT PACED: 0 %
MDC IDC STAT BRADY RV PERCENT PACED: 99.67 %

## 2014-01-01 NOTE — Progress Notes (Signed)
Wound check appointment. Steri-strips removed. Wound without redness or edema. Incision edges approximated, wound well healed. Normal device function. Thresholds, sensing, and impedances consistent with implant measurements. Device programmed at 3.5V/auto capture programmed on for extra safety margin until 3 month visit. Histogram distribution appropriate for patient and level of activity. >99% AT/AF burden + ASA only---hx falling. No high ventricular rates noted. Patient educated about wound care, arm mobility, lifting restrictions. ROV w/ Dr. Johney FrameAllred 03/06/14.

## 2014-01-02 ENCOUNTER — Telehealth: Payer: Self-pay | Admitting: *Deleted

## 2014-01-02 NOTE — Telephone Encounter (Signed)
LMOVM w/ my direct #. 

## 2014-01-02 NOTE — Telephone Encounter (Signed)
I returned Kari Ford's msg who was returning mine. Updated her that wound check went very well. All device functions were normal. The incision looked great. Kari Ford was concerned her grandmother had bruising. I let her know some bruising is normal. What I saw at the wound check was not of concern at that time.

## 2014-01-13 ENCOUNTER — Encounter: Payer: Self-pay | Admitting: Internal Medicine

## 2014-02-05 ENCOUNTER — Encounter (HOSPITAL_COMMUNITY): Payer: Self-pay | Admitting: Internal Medicine

## 2014-03-06 ENCOUNTER — Encounter (INDEPENDENT_AMBULATORY_CARE_PROVIDER_SITE_OTHER): Payer: Self-pay

## 2014-03-06 ENCOUNTER — Encounter: Payer: Self-pay | Admitting: Internal Medicine

## 2014-03-06 ENCOUNTER — Ambulatory Visit (INDEPENDENT_AMBULATORY_CARE_PROVIDER_SITE_OTHER): Payer: Medicare PPO | Admitting: Internal Medicine

## 2014-03-06 VITALS — BP 120/62 | HR 60 | Ht 66.0 in | Wt 151.4 lb

## 2014-03-06 DIAGNOSIS — I441 Atrioventricular block, second degree: Secondary | ICD-10-CM

## 2014-03-06 DIAGNOSIS — I4891 Unspecified atrial fibrillation: Secondary | ICD-10-CM | POA: Diagnosis not present

## 2014-03-06 DIAGNOSIS — R296 Repeated falls: Secondary | ICD-10-CM

## 2014-03-06 DIAGNOSIS — R001 Bradycardia, unspecified: Secondary | ICD-10-CM

## 2014-03-06 LAB — MDC_IDC_ENUM_SESS_TYPE_INCLINIC
Battery Remaining Longevity: 145.2 mo
Battery Voltage: 3.02 V
Brady Statistic RA Percent Paced: 0 %
Brady Statistic RV Percent Paced: 99.9 %
Implantable Pulse Generator Serial Number: 7677103
Lead Channel Impedance Value: 537.5 Ohm
Lead Channel Pacing Threshold Amplitude: 0.625 V
Lead Channel Pacing Threshold Pulse Width: 0.4 ms
Lead Channel Sensing Intrinsic Amplitude: 1.4 mV
Lead Channel Setting Pacing Amplitude: 0.875
Lead Channel Setting Pacing Amplitude: 3.5 V
Lead Channel Setting Pacing Pulse Width: 0.4 ms
Lead Channel Setting Sensing Sensitivity: 2 mV
MDC IDC MSMT LEADCHNL RV IMPEDANCE VALUE: 750 Ohm
MDC IDC MSMT LEADCHNL RV SENSING INTR AMPL: 12 mV
MDC IDC SESS DTM: 20160108150538

## 2014-03-06 NOTE — Patient Instructions (Signed)
Your physician wants you to follow-up in: 6 months with Kari Ford, Kari Ford Bonita QuinYou will receive a reminder letter in the mail two months in advance. If you don't receive a letter, please call our office to schedule the follow-up appointment.  Remote monitoring is used to monitor your Pacemaker or ICD from home. This monitoring reduces the number of office visits required to check your device to one time per year. It allows us to keep an eye on the functioning of your device to ensure it is working properly. You are scheduled for a device check from home on 06/08/14. You may send your transmission at any time that day. If you have a wireless device, the transmission will be sent automatically. After your physician reviews your transmission, you will receive a postcard with your next transmission date.

## 2014-03-06 NOTE — Progress Notes (Signed)
Kari Ford is a 81 y.o. female who presents today for routine electrophysiology followup.  Since her pacemaker was implanted, the patient reports doing very well.  Her energy is improved.  She has chronic edema.  Today, she denies symptoms of palpitations, chest pain, shortness of breath,   dizziness, presyncope, or syncope.  The patient is otherwise without complaint today.   Past Medical History  Diagnosis Date  . Diabetes mellitus   . Hypertension   . Hypothyroidism   . Blood transfusion 1952    Bld Transfusion w/C/S  - [facility] -[place], [place]  . Seasonal allergies     no meds  . Arthritis     shoulder, back  . Clostridium difficile diarrhea   . Mobitz type II atrioventricular block 12/20/13    PPM implanted-See surgical history   Past Surgical History  Procedure Laterality Date  . Eye surgery      bilateral - cataract surgery  . Svd      x 1  . Cyst excision      uterine   . Pacemaker insertion  12/24/13    St Jude Medical Assurity DR  model [id number] (serial number  [id number]) pacemaker  . Permanent pacemaker insertion N/A 12/23/2013    Procedure: PERMANENT PACEMAKER INSERTION;  Surgeon: Gardiner Rhyme, MD;  Location: [facility] CATH LAB;  Service: Cardiovascular;  Laterality: N/A;    ROS- all systems are reviewed and negative except as per HPI above  Current Outpatient Prescriptions  Medication Sig Dispense Refill  . amLODipine (NORVASC) 5 MG tablet Take 5 mg by mouth daily.    . cetirizine (ZYRTEC) 5 MG tablet Take 5 mg by mouth daily.    . cholecalciferol (VITAMIN D) 1000 UNITS tablet Take 2,000 Units by mouth daily.    . cyanocobalamin (,VITAMIN B-12,) 1000 MCG/ML injection Inject 1,000 mcg into the muscle every Friday.    . furosemide (LASIX) 20 MG tablet Take 20 mg by mouth every morning.    . insulin aspart (NOVOLOG) 100 UNIT/ML injection Inject 0-15 Units into the skin 3 (three) times daily as needed for high blood sugar (per sliding scale).    . insulin NPH Human  (HUMULIN N,NOVOLIN N) 100 UNIT/ML injection Inject 8 Units into the skin at bedtime.    . irbesartan (AVAPRO) 300 MG tablet Take 300 mg by mouth daily.    Marland Kitchen levothyroxine (SYNTHROID, LEVOTHROID) 50 MCG tablet Take 50 mcg by mouth daily before breakfast.    . Multiple Vitamins-Minerals (THERAGRAN-M PO) Take 1 tablet by mouth daily.    Marland Kitchen OVER THE COUNTER MEDICATION Take 1 tablet by mouth daily. theragram    . tiZANidine (ZANAFLEX) 2 MG tablet Take 2 mg by mouth daily as needed for muscle spasms.    . traMADol (ULTRAM) 50 MG tablet Take 25 mg by mouth every 6 (six) hours as needed.     No current facility-administered medications for this visit.    Physical Exam: Filed Vitals:   03/06/14 1452  BP: 120/62  Pulse: 60  Height:  (1.676 m)  Weight: 151 lb 6.4 oz (68.675 kg)    GEN- The patient is elderly appearing, alert and oriented x 3 today.   Head- normocephalic, atraumatic Eyes-  Sclera clear, conjunctiva pink Ears- hearing intact Oropharynx- clear with poor dentition Lungs- Clear to ausculation bilaterally, normal work of breathing Chest- pacemaker pocket is well healed Heart- Regular rate and rhythm, no murmurs, rubs or gallops, PMI not laterally displaced GI- soft,  NT, ND, + BS+ dependant edema  Pacemaker interrogation- reviewed in detail today,  See PACEART report  Assessment and Plan:  1. Mobitz II second degree AV block Normal pacemaker function See Pace Art report No changes today  2. Persistent afib Rate controlled Not a candidate for anticoaguation due to frequent falls/ fradgility  Merlin Return to see the EP NP in 6 months I will see in a year

## 2014-03-19 ENCOUNTER — Encounter: Payer: Self-pay | Admitting: Internal Medicine

## 2014-06-08 ENCOUNTER — Ambulatory Visit (INDEPENDENT_AMBULATORY_CARE_PROVIDER_SITE_OTHER): Payer: Medicare PPO | Admitting: *Deleted

## 2014-06-08 ENCOUNTER — Encounter: Payer: Self-pay | Admitting: Internal Medicine

## 2014-06-08 DIAGNOSIS — I441 Atrioventricular block, second degree: Secondary | ICD-10-CM

## 2014-06-08 LAB — MDC_IDC_ENUM_SESS_TYPE_REMOTE
Battery Remaining Longevity: 118 mo
Battery Remaining Percentage: 95.5 %
Battery Voltage: 3.01 V
Brady Statistic AP VP Percent: 0 %
Brady Statistic AP VS Percent: 0 %
Brady Statistic AS VP Percent: 0 %
Brady Statistic AS VS Percent: 0 %
Brady Statistic RA Percent Paced: 1 %
Date Time Interrogation Session: 20160411060020
Implantable Pulse Generator Model: 2240
Implantable Pulse Generator Serial Number: 7677103
Lead Channel Impedance Value: 650 Ohm
Lead Channel Pacing Threshold Pulse Width: 0.4 ms
Lead Channel Sensing Intrinsic Amplitude: 12 mV
Lead Channel Setting Pacing Amplitude: 3.5 V
MDC IDC MSMT LEADCHNL RA IMPEDANCE VALUE: 490 Ohm
MDC IDC MSMT LEADCHNL RA SENSING INTR AMPL: 1.4 mV
MDC IDC MSMT LEADCHNL RV PACING THRESHOLD AMPLITUDE: 0.625 V
MDC IDC SET LEADCHNL RV PACING AMPLITUDE: 0.875
MDC IDC SET LEADCHNL RV PACING PULSEWIDTH: 0.4 ms
MDC IDC SET LEADCHNL RV SENSING SENSITIVITY: 2 mV
MDC IDC STAT BRADY RV PERCENT PACED: 99 %

## 2014-06-08 NOTE — Progress Notes (Signed)
Remote pacemaker transmission.   

## 2014-06-18 ENCOUNTER — Encounter: Payer: Self-pay | Admitting: Cardiology

## 2014-09-08 ENCOUNTER — Ambulatory Visit (INDEPENDENT_AMBULATORY_CARE_PROVIDER_SITE_OTHER): Payer: Medicare PPO | Admitting: *Deleted

## 2014-09-08 DIAGNOSIS — I441 Atrioventricular block, second degree: Secondary | ICD-10-CM | POA: Diagnosis not present

## 2014-09-08 NOTE — Progress Notes (Signed)
Remote pacemaker transmission.   

## 2014-09-10 LAB — CUP PACEART REMOTE DEVICE CHECK
Battery Remaining Longevity: 115 mo
Battery Voltage: 3.01 V
Brady Statistic AP VP Percent: 0 %
Brady Statistic RA Percent Paced: 1 %
Brady Statistic RV Percent Paced: 99 %
Lead Channel Pacing Threshold Pulse Width: 0.4 ms
Lead Channel Setting Pacing Amplitude: 3.5 V
Lead Channel Setting Sensing Sensitivity: 2 mV
MDC IDC MSMT BATTERY REMAINING PERCENTAGE: 95.5 %
MDC IDC MSMT LEADCHNL RA IMPEDANCE VALUE: 530 Ohm
MDC IDC MSMT LEADCHNL RV IMPEDANCE VALUE: 650 Ohm
MDC IDC MSMT LEADCHNL RV PACING THRESHOLD AMPLITUDE: 0.625 V
MDC IDC SESS DTM: 20160712081000
MDC IDC SET LEADCHNL RV PACING AMPLITUDE: 0.875
MDC IDC SET LEADCHNL RV PACING PULSEWIDTH: 0.4 ms
MDC IDC STAT BRADY AP VS PERCENT: 0 %
MDC IDC STAT BRADY AS VP PERCENT: 0 %
MDC IDC STAT BRADY AS VS PERCENT: 0 %
Pulse Gen Model: 2240
Pulse Gen Serial Number: 7677103

## 2014-10-02 ENCOUNTER — Encounter: Payer: Self-pay | Admitting: Cardiology

## 2014-10-07 ENCOUNTER — Encounter: Payer: Self-pay | Admitting: Internal Medicine

## 2014-12-29 ENCOUNTER — Encounter: Payer: Self-pay | Admitting: *Deleted

## 2015-02-23 ENCOUNTER — Encounter: Payer: Self-pay | Admitting: *Deleted

## 2015-03-31 ENCOUNTER — Encounter: Payer: Self-pay | Admitting: *Deleted

## 2015-09-14 IMAGING — CR DG CHEST 2V
2 series · 2 of 2 positions shown · non-contrast
Comparison: December 20, 2013

CLINICAL DATA: Cardiac arrhythmia with pacemaker insertion;
difficulty breathing

EXAM:
CHEST  2 VIEW

[w chest lat]
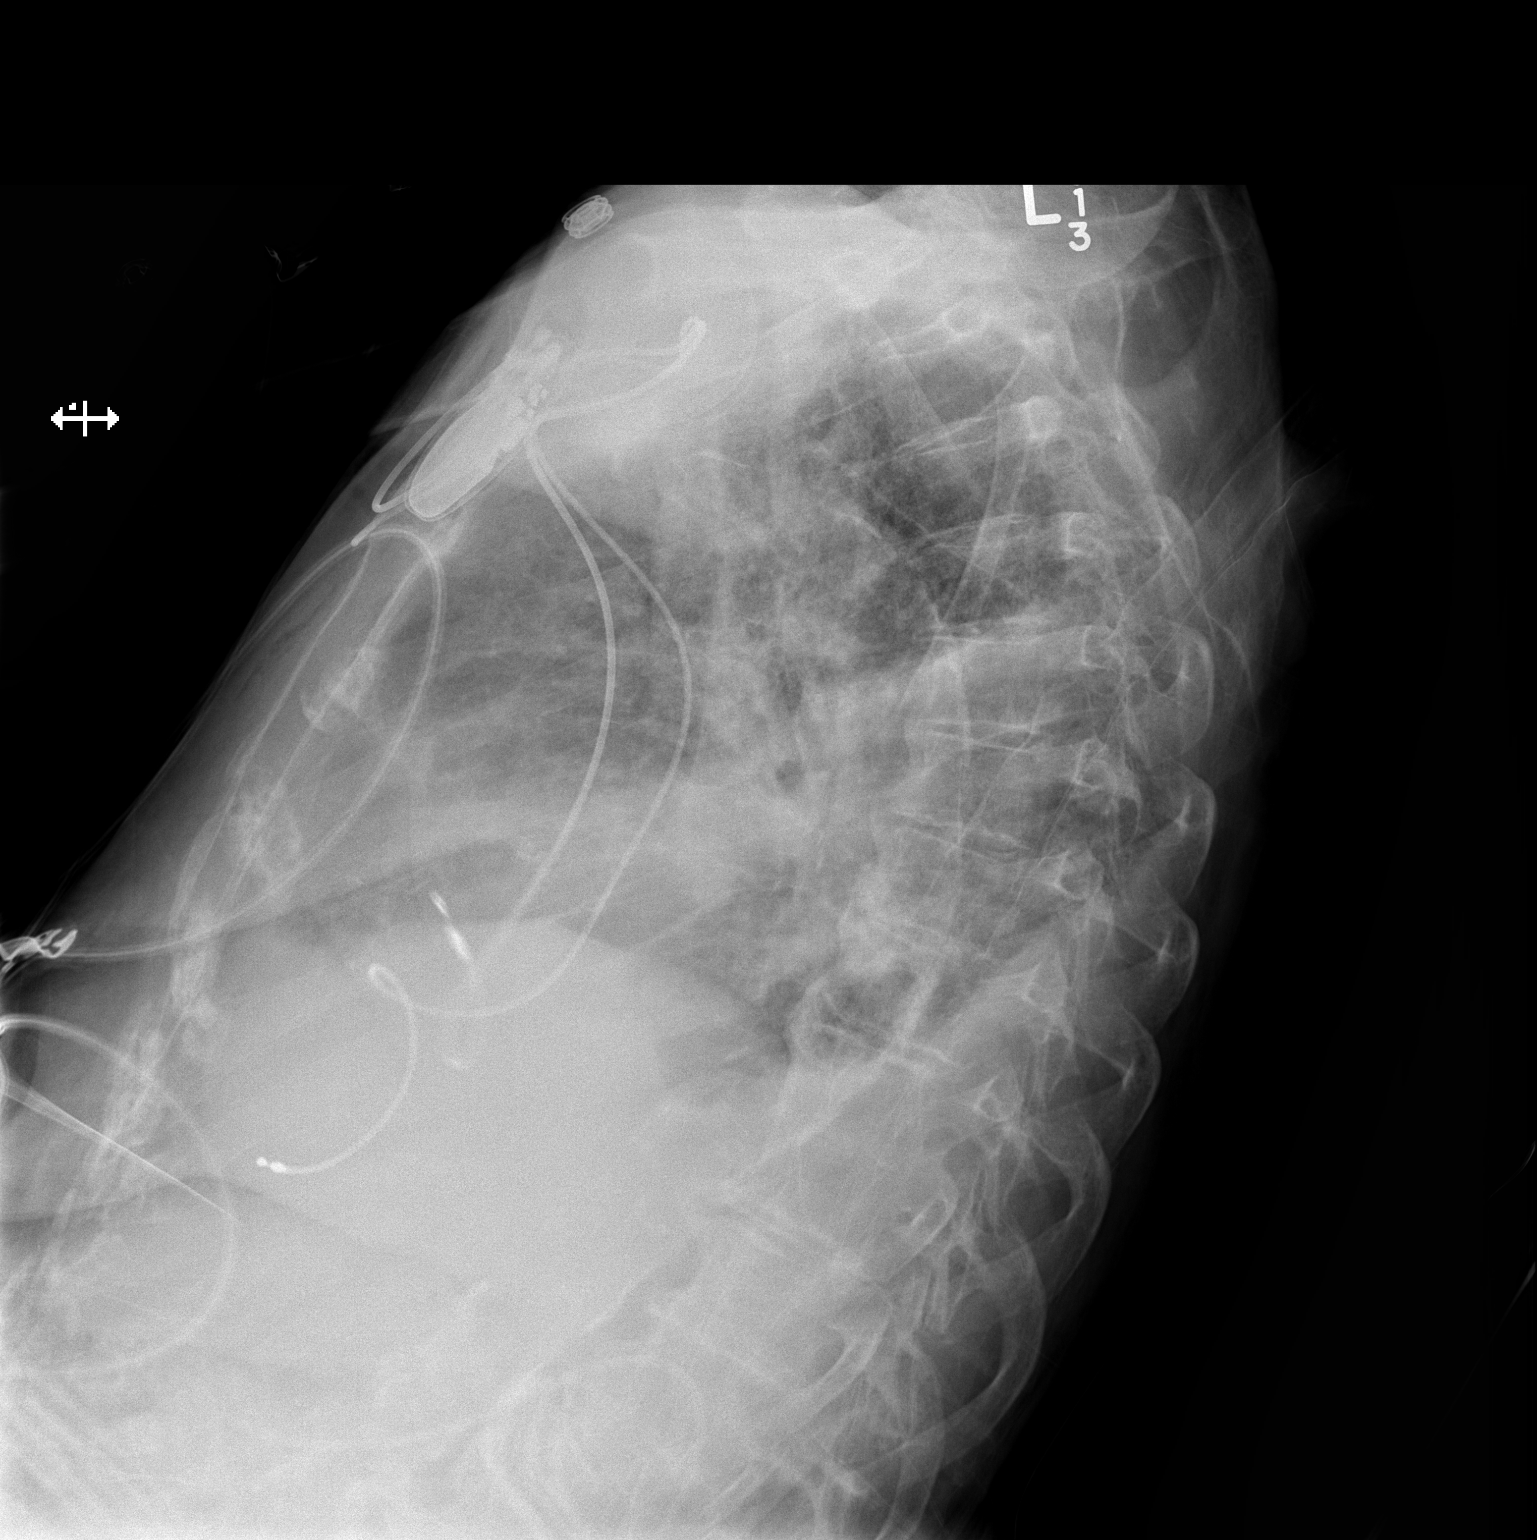

[x chest ap]
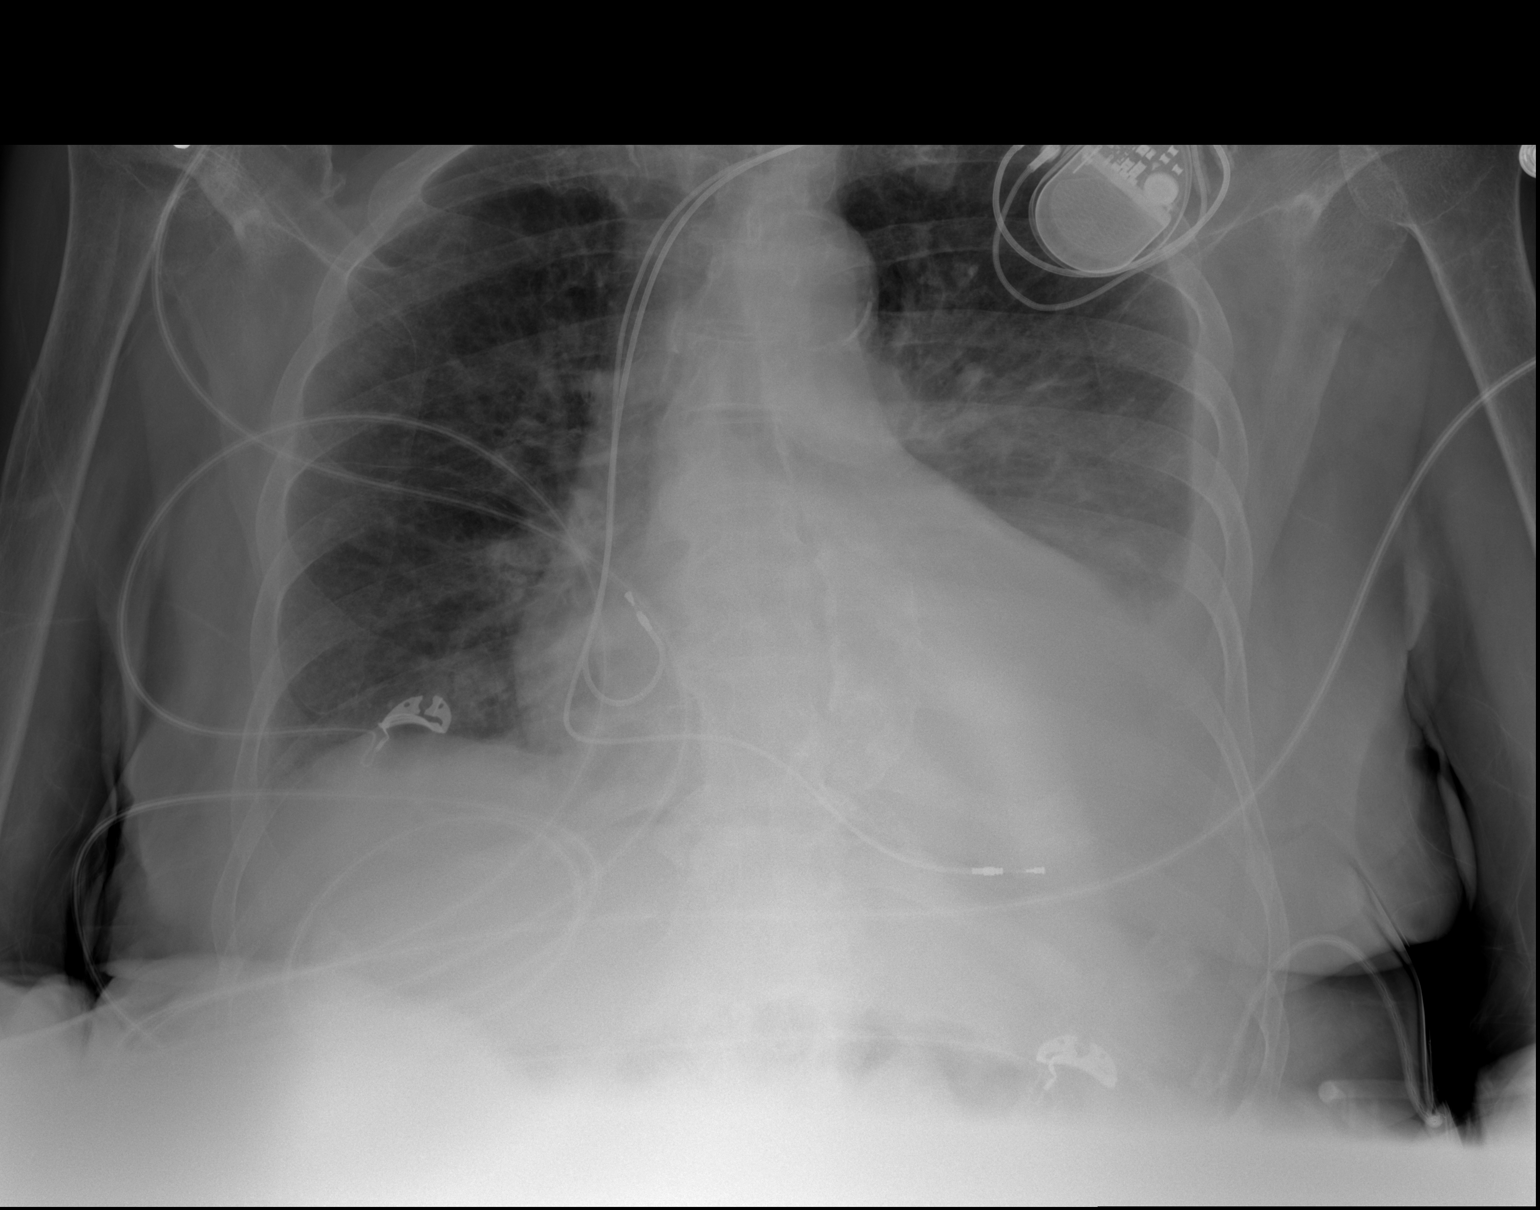

[2 of 2 positions shown; findings below may reference images not displayed]

FINDINGS: There is no a pacemaker present with lead tips attached to the right
atrium and right ventricle. No pneumothorax. There is a left pleural
effusion with left lower lobe consolidation. The right lung is
clear. Heart is enlarged with pulmonary vascularity within normal
limits. There is extensive atherosclerotic change throughout the
aorta. No adenopathy. There are changes indicative of chronic
rotator cuff tears bilaterally. Specifically, there is superior
migration of both humeral heads. There is degenerative change in the
thoracic spine.
IMPRESSION: Pacemaker leads as described. No pneumothorax. Left effusion with
left lower lobe consolidation. Stable cardiomegaly. Extensive
atherosclerotic change.

## 2015-10-27 ENCOUNTER — Emergency Department (HOSPITAL_COMMUNITY): Payer: Medicare Other

## 2015-10-27 ENCOUNTER — Encounter (HOSPITAL_COMMUNITY): Payer: Self-pay | Admitting: Nurse Practitioner

## 2015-10-27 ENCOUNTER — Emergency Department (HOSPITAL_COMMUNITY)
Admission: EM | Admit: 2015-10-27 | Discharge: 2015-10-27 | Disposition: A | Discharge status: Home / Self Care | Payer: Medicare Other | Attending: Emergency Medicine | Admitting: Emergency Medicine

## 2015-10-27 DIAGNOSIS — Y999 Unspecified external cause status: Secondary | ICD-10-CM | POA: Diagnosis not present

## 2015-10-27 DIAGNOSIS — S0181XA Laceration without foreign body of other part of head, initial encounter: Secondary | ICD-10-CM | POA: Diagnosis not present

## 2015-10-27 DIAGNOSIS — Z5181 Encounter for therapeutic drug level monitoring: Secondary | ICD-10-CM | POA: Insufficient documentation

## 2015-10-27 DIAGNOSIS — Y939 Activity, unspecified: Secondary | ICD-10-CM | POA: Insufficient documentation

## 2015-10-27 DIAGNOSIS — E119 Type 2 diabetes mellitus without complications: Secondary | ICD-10-CM | POA: Diagnosis not present

## 2015-10-27 DIAGNOSIS — Z7982 Long term (current) use of aspirin: Secondary | ICD-10-CM | POA: Diagnosis not present

## 2015-10-27 DIAGNOSIS — S0990XA Unspecified injury of head, initial encounter: Secondary | ICD-10-CM

## 2015-10-27 DIAGNOSIS — Y92129 Unspecified place in nursing home as the place of occurrence of the external cause: Secondary | ICD-10-CM | POA: Diagnosis not present

## 2015-10-27 DIAGNOSIS — E039 Hypothyroidism, unspecified: Secondary | ICD-10-CM | POA: Insufficient documentation

## 2015-10-27 DIAGNOSIS — S61412A Laceration without foreign body of left hand, initial encounter: Secondary | ICD-10-CM | POA: Diagnosis not present

## 2015-10-27 DIAGNOSIS — W050XXA Fall from non-moving wheelchair, initial encounter: Secondary | ICD-10-CM | POA: Insufficient documentation

## 2015-10-27 DIAGNOSIS — Z79899 Other long term (current) drug therapy: Secondary | ICD-10-CM | POA: Insufficient documentation

## 2015-10-27 DIAGNOSIS — I10 Essential (primary) hypertension: Secondary | ICD-10-CM | POA: Diagnosis not present

## 2015-10-27 DIAGNOSIS — S0083XA Contusion of other part of head, initial encounter: Secondary | ICD-10-CM

## 2015-10-27 DIAGNOSIS — Z794 Long term (current) use of insulin: Secondary | ICD-10-CM | POA: Diagnosis not present

## 2015-10-27 LAB — BASIC METABOLIC PANEL
ANION GAP: 6 (ref 5–15)
BUN: 34 mg/dL — AB (ref 6–20)
CHLORIDE: 103 mmol/L (ref 101–111)
CO2: 30 mmol/L (ref 22–32)
Calcium: 8.9 mg/dL (ref 8.9–10.3)
Creatinine, Ser: 0.89 mg/dL (ref 0.44–1.00)
GFR calc Af Amer: >60 mL/min (ref >60)
GFR calc non Af Amer: 54 mL/min — ABNORMAL LOW (ref >60)
GLUCOSE: 270 mg/dL — AB (ref 65–99)
POTASSIUM: 4.8 mmol/L (ref 3.5–5.1)
Sodium: 139 mmol/L (ref 135–145)

## 2015-10-27 LAB — CBC
HEMATOCRIT: 33.3 % — AB (ref 36.0–46.0)
HEMOGLOBIN: 11 g/dL — AB (ref 12.0–15.0)
MCH: 30.5 pg (ref 26.0–34.0)
MCHC: 33 g/dL (ref 30.0–36.0)
MCV: 92.2 fL (ref 78.0–100.0)
Platelets: 190 10*3/uL (ref 150–400)
RBC: 3.61 MIL/uL — AB (ref 3.87–5.11)
RDW: 15.3 % (ref 11.5–15.5)
WBC: 7.1 10*3/uL (ref 4.0–10.5)

## 2015-10-27 LAB — PROTIME-INR
INR: 1.18
Prothrombin Time: 15.1 seconds (ref 11.4–15.2)

## 2015-10-27 MED ORDER — MORPHINE SULFATE (PF) 4 MG/ML IV SOLN
4.0000 mg | Freq: Once | INTRAVENOUS | Status: AC
  Administered 2015-10-27: 4 mg via INTRAVENOUS
  Filled 2015-10-27: qty 1

## 2015-10-27 MED ORDER — LIDOCAINE-EPINEPHRINE 2 %-1:100000 IJ SOLN
20.0000 mL | Freq: Once | INTRAMUSCULAR | Status: AC
Start: 2015-10-27 — End: 2015-10-27
  Administered 2015-10-27: 20 mL
  Filled 2015-10-27: qty 1

## 2015-10-27 NOTE — Discharge Instructions (Signed)
The sutures placed on the face will need to be removed in 5 days.  There is a single suture placed on the left hand which will need to be removed in 10 days

## 2015-10-27 NOTE — ED Notes (Signed)
Bed: WA03 Expected date:  Expected time:  Means of arrival:  Comments: EMS- 89y F, fall/head and hand lac/no thinners

## 2015-10-27 NOTE — ED Provider Notes (Signed)
[facility]-EMERGENCY DEPT Provider Note   CSN: [id number] Arrival date & time: 10/27/15  1032     History   Chief Complaint Chief Complaint  Patient presents with  . Laceration  . Fall   Level V caveat: Memory impairment  HPI Kari Ford is a 82 y.o. female.  The history is provided by the patient.   Patient is brought to the emergency department from her nursing home after witnessed fall today.  She fell and struck the front of her face.  She presents with a stellate laceration to her left forehead without active bleeding.  Per the medical record she is not on anticoagulation.  A small skin tear was also noted to her right anterior tibia has small laceration noted to her left hand.  Transferred to the ER via EMS    Past Medical History:  Diagnosis Date  . Arthritis    shoulder, back  . Blood transfusion 1952   Bld Transfusion w/C/S  - [facility] -[place], [place]  . Clostridium difficile diarrhea   . Diabetes mellitus   . Hypertension   . Hypothyroidism   . Mobitz type II atrioventricular block 12/20/13   PPM implanted-See surgical history  . Seasonal allergies    no meds    Patient Active Problem List   Diagnosis Date Noted  . Second degree AV block 12/23/2013  . Atrial fibrillation (HCC) 12/22/2013  . Pulmonary HTN (HCC) 12/22/2013  . HTN (hypertension) 12/22/2013  . Bradycardia 12/20/2013  . Dehydration 06/11/2013  . Falls 06/11/2013  . Falls frequently 06/11/2013  . Cellulitis of leg, left 08/30/2012  . Diabetes mellitus, type 2 (HCC) 08/30/2012  . Hypothyroidism 08/30/2012  . Hypokalemia 08/30/2012    Past Surgical History:  Procedure Laterality Date  . CYST EXCISION     uterine   . EYE SURGERY     bilateral - cataract surgery  . PACEMAKER INSERTION  12/24/13   [employer] DR  model [id number] (serial number  [id number]) pacemaker  . PERMANENT PACEMAKER INSERTION N/A 12/23/2013   Procedure: PERMANENT PACEMAKER INSERTION;  Surgeon: Gardiner Rhyme, MD;  Location: [facility] CATH LAB;  Service: Cardiovascular;  Laterality: N/A;  . SVD     x 1    OB History    No data available       Home Medications    Prior to Admission medications   Medication Sig Start Date End Date Taking? Authorizing Provider  Amino Acids-Protein Hydrolys (FEEDING SUPPLEMENT, PRO-STAT SUGAR FREE 64,) LIQD Take 30 mLs by mouth 2 (two) times daily.   Yes Historical Provider, MD  amLODipine (NORVASC) 5 MG tablet Take 5 mg by mouth daily.   Yes Historical Provider, MD  aspirin EC 81 MG tablet Take 81 mg by mouth daily.   Yes Historical Provider, MD  cetirizine (ZYRTEC) 5 MG tablet Take 5 mg by mouth at bedtime.    Yes Historical Provider, MD  cholecalciferol (VITAMIN D) 1000 UNITS tablet Take 2,000 Units by mouth daily.   Yes Historical Provider, MD  cyanocobalamin (,VITAMIN B-12,) 1000 MCG/ML injection Inject 1,000 mcg into the muscle every Friday.   Yes Historical Provider, MD  Eyelid Cleansers (OCUSOFT EYELID CLEANSING) PADS Apply 1 application topically daily.   Yes Historical Provider, MD  furosemide (LASIX) 20 MG tablet Take 20 mg by mouth every morning.   Yes Historical Provider, MD  glucagon (GLUCAGON EMERGENCY) 1 MG injection Inject 1 mg into the vein once as needed (hypoglycemia).  Yes Historical Provider, MD  insulin NPH Human (HUMULIN N,NOVOLIN N) 100 UNIT/ML injection Inject 10 Units into the skin daily. In the morning at 8am   Yes Historical Provider, MD  irbesartan (AVAPRO) 300 MG tablet Take 300 mg by mouth daily.   Yes Historical Provider, MD  levothyroxine (SYNTHROID, LEVOTHROID) 88 MCG tablet Take 88 mcg by mouth daily before breakfast.   Yes Historical Provider, MD  Multiple Vitamins-Minerals (DECUBI-VITE) CAPS Take 1 capsule by mouth daily.   Yes Historical Provider, MD  Multiple Vitamins-Minerals (PRESERVISION AREDS 2) CAPS Take 1 capsule by mouth 2 (two) times daily.   Yes Historical Provider, MD  Multiple Vitamins-Minerals (THERAGRAN-M PO)  Take 1 tablet by mouth daily.   Yes Historical Provider, MD  nitrofurantoin (MACRODANTIN) 50 MG capsule Take 50 mg by mouth at bedtime.   Yes Historical Provider, MD  NUTRITIONAL SUPPLEMENT LIQD Take 120 mLs by mouth 3 (three) times daily. Med pass 2.0   Yes Historical Provider, MD  Probiotic Product (PROBIOTIC PO) Take 1 capsule by mouth 2 (two) times daily.   Yes Historical Provider, MD  Propylene Glycol (SYSTANE BALANCE) 0.6 % SOLN Place 1 drop into both eyes 2 (two) times daily.   Yes Historical Provider, MD  tiZANidine (ZANAFLEX) 2 MG tablet Take 2 mg by mouth at bedtime as needed for muscle spasms.    Yes Historical Provider, MD  traMADol (ULTRAM) 50 MG tablet Take 25 mg by mouth 2 (two) times daily.    Yes Historical Provider, MD    Family History Family History  Problem Relation Age of Onset  . Peripheral vascular disease Sister   . Arrhythmia Sister     Social History Social History  Substance Use Topics  . Smoking status: Never Smoker  . Smokeless tobacco: Never Used  . Alcohol use No     Allergies   Penicillins   Review of Systems Review of Systems  Unable to perform ROS: Other     Physical Exam Updated Vital Signs BP 118/66 (BP Location: Right Arm)   Pulse 72   Resp 18   SpO2 99%   Physical Exam  Constitutional: She is oriented to person, place, and time. She appears well-developed and well-nourished. No distress.  HENT:  Stellate laceration with associated hematoma of her left forehead.  No active bleeding.  Patient with noted bruising of the left periorbital region.  No obvious trismus or mandibular tenderness  Eyes: EOM are normal.  Neck: Neck supple.  Mild cervical paracervical tenderness without cervical step-off.  Cardiovascular: Normal rate and regular rhythm.   Pulmonary/Chest: Effort normal and breath sounds normal.  Abdominal: Soft. She exhibits no distension. There is no tenderness.  Musculoskeletal:  Mild pain with range of motion bilateral  hips as well as left knee.  Significant contractures of both upper and lower extremities.  Small nonbleeding laceration to her left hand overlying the hypothenar eminence full range of motion of the fingers of left hand.  Normal range of motion of the left wrist.  Normal left radial pulse.  Neurological: She is alert and oriented to person, place, and time.  Skin: Skin is warm and dry.  Psychiatric: She has a normal mood and affect. Judgment normal.  Nursing note and vitals reviewed.    ED Treatments / Results  Labs (all labs ordered are listed, but only abnormal results are displayed) Labs Reviewed  CBC - Abnormal; Notable for the following:       Result Value   RBC 3.61 (*)  Hemoglobin 11.0 (*)    HCT 33.3 (*)    All other components within normal limits  BASIC METABOLIC PANEL - Abnormal; Notable for the following:    Glucose, Bld 270 (*)    BUN 34 (*)    GFR calc non Af Amer 54 (*)    All other components within normal limits  PROTIME-INR    EKG  EKG Interpretation None       Radiology Ct Head Wo Contrast  Result Date: 10/27/2015 CLINICAL DATA:  Fall.  Head laceration.  Neck pain EXAM: CT HEAD WITHOUT CONTRAST CT MAXILLOFACIAL WITHOUT CONTRAST CT CERVICAL SPINE WITHOUT CONTRAST TECHNIQUE: Multidetector CT imaging of the head, cervical spine, and maxillofacial structures were performed using the standard protocol without intravenous contrast. Multiplanar CT image reconstructions of the cervical spine and maxillofacial structures were also generated. COMPARISON:  CT head 06/11/2013  . FINDINGS: CT HEAD FINDINGS Moderate to advanced atrophy unchanged. Chronic microvascular ischemic change in the white matter. Negative for acute infarct. Negative for hemorrhage or fluid collection 10 mm partially calcified mass adjacent to the right parietal bone unchanged most likely meningioma. Calcification along the left squamosal portion of the temporal bone also likely meningioma and  unchanged. No brain edema or midline shift. Negative for skull fracture. CT MAXILLOFACIAL FINDINGS Negative for acute facial fracture. No fracture of the orbit. Irregularity of the nasal bone may be due to old fracture. Nasal septum significantly deviated to the left. Nasal bone deviated to the right. No fracture of the zygomatic arch. No fracture of the mandible. Degenerative change in the TMJ bilaterally left greater than right Deep laceration left frontal scalp. CT CERVICAL SPINE FINDINGS Negative for cervical spine fracture Slight anterior listhesis C4-5. Moderate disc degeneration and spurring C5-6 and C6-7. Diffuse facet degeneration. No acute skeletal abnormality. Carotid artery calcification bilaterally. No soft tissue mass in the neck. IMPRESSION: Atrophy and chronic microvascular ischemia. No acute intracranial abnormality Negative for facial fracture.  Laceration left frontal scalp Negative for cervical spine fracture. Electronically Signed   By: Marlan Palau M.D.   On: 10/27/2015 13:03   Ct Cervical Spine Wo Contrast  Result Date: 10/27/2015 CLINICAL DATA:  Fall.  Head laceration.  Neck pain EXAM: CT HEAD WITHOUT CONTRAST CT MAXILLOFACIAL WITHOUT CONTRAST CT CERVICAL SPINE WITHOUT CONTRAST TECHNIQUE: Multidetector CT imaging of the head, cervical spine, and maxillofacial structures were performed using the standard protocol without intravenous contrast. Multiplanar CT image reconstructions of the cervical spine and maxillofacial structures were also generated. COMPARISON:  CT head 06/11/2013  . FINDINGS: CT HEAD FINDINGS Moderate to advanced atrophy unchanged. Chronic microvascular ischemic change in the white matter. Negative for acute infarct. Negative for hemorrhage or fluid collection 10 mm partially calcified mass adjacent to the right parietal bone unchanged most likely meningioma. Calcification along the left squamosal portion of the temporal bone also likely meningioma and unchanged. No  brain edema or midline shift. Negative for skull fracture. CT MAXILLOFACIAL FINDINGS Negative for acute facial fracture. No fracture of the orbit. Irregularity of the nasal bone may be due to old fracture. Nasal septum significantly deviated to the left. Nasal bone deviated to the right. No fracture of the zygomatic arch. No fracture of the mandible. Degenerative change in the TMJ bilaterally left greater than right Deep laceration left frontal scalp. CT CERVICAL SPINE FINDINGS Negative for cervical spine fracture Slight anterior listhesis C4-5. Moderate disc degeneration and spurring C5-6 and C6-7. Diffuse facet degeneration. No acute skeletal abnormality. Carotid artery calcification bilaterally. No  soft tissue mass in the neck. IMPRESSION: Atrophy and chronic microvascular ischemia. No acute intracranial abnormality Negative for facial fracture.  Laceration left frontal scalp Negative for cervical spine fracture. Electronically Signed   By: Marlan Palau M.D.   On: 10/27/2015 13:03   Dg Knee Complete 4 Views Left  Result Date: 10/27/2015 CLINICAL DATA:  82 year old female with history of trauma from a fall complaining of left-sided knee pain. EXAM: LEFT KNEE - COMPLETE 4+ VIEW COMPARISON:  No priors. FINDINGS: Five views of the left knee demonstrate no acute displaced fracture, subluxation or dislocation. There is joint space narrowing, subchondral sclerosis, subchondral cyst formation and osteophyte formation in a tricompartmental distribution, most severe in the patellofemoral compartment, compatible with osteoarthritis. Atherosclerosis. IMPRESSION: 1. No acute radiographic abnormality of the left knee. 2. Tricompartmental osteoarthritis, most severe in the patellofemoral compartment. 3. Atherosclerosis. Electronically Signed   By: Trudie Reed M.D.   On: 10/27/2015 12:21   Dg Hip Unilat W Or Wo Pelvis 2-3 Views Left  Result Date: 10/27/2015 CLINICAL DATA:  Fall from wheelchair EXAM: DG HIP (WITH  OR WITHOUT PELVIS) 2-3V LEFT COMPARISON:  None FINDINGS: The bones appear osteopenic. There is moderate bilateral hip osteoarthritis identified. Degenerative disc disease noted within the lumbar spine. There is no acute fracture or subluxation identified. IMPRESSION: 1. No acute findings. 2. If there is high clinical suspicion for occult fracture or the patient refuses to weightbear, consider further evaluation with MRI. Although CT is expeditious, evidence is lacking regarding accuracy of CT over plain film radiography. 3. Degenerative changes 4. Osteopenia Electronically Signed   By: Signa Kell M.D.   On: 10/27/2015 12:22   Ct Maxillofacial Wo Contrast  Result Date: 10/27/2015 CLINICAL DATA:  Fall.  Head laceration.  Neck pain EXAM: CT HEAD WITHOUT CONTRAST CT MAXILLOFACIAL WITHOUT CONTRAST CT CERVICAL SPINE WITHOUT CONTRAST TECHNIQUE: Multidetector CT imaging of the head, cervical spine, and maxillofacial structures were performed using the standard protocol without intravenous contrast. Multiplanar CT image reconstructions of the cervical spine and maxillofacial structures were also generated. COMPARISON:  CT head 06/11/2013  . FINDINGS: CT HEAD FINDINGS Moderate to advanced atrophy unchanged. Chronic microvascular ischemic change in the white matter. Negative for acute infarct. Negative for hemorrhage or fluid collection 10 mm partially calcified mass adjacent to the right parietal bone unchanged most likely meningioma. Calcification along the left squamosal portion of the temporal bone also likely meningioma and unchanged. No brain edema or midline shift. Negative for skull fracture. CT MAXILLOFACIAL FINDINGS Negative for acute facial fracture. No fracture of the orbit. Irregularity of the nasal bone may be due to old fracture. Nasal septum significantly deviated to the left. Nasal bone deviated to the right. No fracture of the zygomatic arch. No fracture of the mandible. Degenerative change in the TMJ  bilaterally left greater than right Deep laceration left frontal scalp. CT CERVICAL SPINE FINDINGS Negative for cervical spine fracture Slight anterior listhesis C4-5. Moderate disc degeneration and spurring C5-6 and C6-7. Diffuse facet degeneration. No acute skeletal abnormality. Carotid artery calcification bilaterally. No soft tissue mass in the neck. IMPRESSION: Atrophy and chronic microvascular ischemia. No acute intracranial abnormality Negative for facial fracture.  Laceration left frontal scalp Negative for cervical spine fracture. Electronically Signed   By: Marlan Palau M.D.   On: 10/27/2015 13:03      +++++++++++++++++++++++++++++++++++++++++++++   Procedures .Marland KitchenLaceration Repair Performed by: Azalia Bilis Authorized by: Azalia Bilis    LACERATION REPAIR #1 Risks and benefits: risks, benefits and alternatives  were discussed Patient identity confirmed: provided demographic data Time out performed prior to procedure Prepped and Draped in normal sterile fashion Wound explored Laceration Location: left forehead Laceration Length: 4cm No Foreign Bodies seen or palpated Anesthesia: local infiltration Local anesthetic: lidocaine 2% with epinephrine Anesthetic total: 8 ml Irrigation method: syringe Amount of cleaning: standard Skin closure: 5-0 prolene Number of sutures or staples: 9 Technique: simple interrupted Patient tolerance: Patient tolerated the procedure well with no immediate complications.   LACERATION REPAIR #2 Performed by: Lyanne CoAMPOS,Corona Popovich M Consent: Verbal consent obtained. Risks and benefits: risks, benefits and alternatives were discussed Patient identity confirmed: provided demographic data Time out performed prior to procedure Prepped and Draped in normal sterile fashion Wound explored Laceration Location: left hand hypothenar eminence Laceration Length: 1cm No Foreign Bodies seen or palpated Anesthesia: local infiltration Local anesthetic:  none Irrigation method: syringe Amount of cleaning: standard Skin closure: 4-0 prolene Number of sutures or staples: 1 Technique: Simple interrupted  Patient tolerance: Patient tolerated the procedure well with no immediate complications.  +++++++++++++++++++++++++++++++++++++++++++++     Medications Ordered in ED Medications  morphine 4 MG/ML injection 4 mg (4 mg Intravenous Given 10/27/15 1245)  lidocaine-EPINEPHrine (XYLOCAINE W/EPI) 2 %-1:100000 (with pres) injection 20 mL (20 mLs Infiltration Given 10/27/15 1341)     Initial Impression / Assessment and Plan / ED Course  I have reviewed the triage vital signs and the nursing notes.  Pertinent labs & imaging results that were available during my care of the patient were reviewed by me and considered in my medical decision making (see chart for details).  Clinical Course    Plain films without abnormality.  CT head, face, C-spine without acute traumatic fractures.  Stellate laceration repaired of her left forehead.  Left hand laceration repaired.  Steri-Strips to the right anterior tibia.  Patient be transient back to his nursing facility.  Final Clinical Impressions(s) / ED Diagnoses   Final diagnoses:  Head injury, initial encounter  Facial contusion, initial encounter  Hand laceration, left, initial encounter  Facial laceration, initial encounter    New Prescriptions New Prescriptions   No medications on file     Azalia BilisKevin Dusty Wagoner, MD 10/27/15 [phone]

## 2015-10-27 NOTE — ED Triage Notes (Signed)
Pt fell out of wheelchair and has a head and hand laceration. From a nursing home. No blood thinners.

## 2016-05-19 ENCOUNTER — Telehealth: Payer: Self-pay | Admitting: Internal Medicine

## 2016-05-19 NOTE — Telephone Encounter (Signed)
Called pt to scheduled past due appt with Gypsy BalsamAmber Seiler. Man answered the phone and stated we had the wrong phone number.

## 2017-09-14 ENCOUNTER — Encounter: Payer: Self-pay | Admitting: Cardiology
